# Patient Record
Sex: Female | Born: 1990 | Race: White | Hispanic: No | Marital: Married | Smoking: Never smoker
Health system: Southern US, Community
[De-identification: ages and names within clinical notes are randomized; demographics above are authoritative.]

## PROBLEM LIST (undated history)

## (undated) DIAGNOSIS — R112 Nausea with vomiting, unspecified: Secondary | ICD-10-CM

## (undated) DIAGNOSIS — Z789 Other specified health status: Secondary | ICD-10-CM

## (undated) DIAGNOSIS — Z9889 Other specified postprocedural states: Secondary | ICD-10-CM

## (undated) HISTORY — DX: Other specified health status: Z78.9

## (undated) HISTORY — DX: Nausea with vomiting, unspecified: R11.2

## (undated) HISTORY — DX: Other specified postprocedural states: Z98.890

---

## 2013-04-09 ENCOUNTER — Observation Stay (HOSPITAL_COMMUNITY)
Admission: EM | Admit: 2013-04-09 | Discharge: 2013-04-11 | Disposition: A | Discharge status: Home / Self Care | Payer: 59 | Attending: General Surgery | Admitting: General Surgery

## 2013-04-09 ENCOUNTER — Encounter (HOSPITAL_COMMUNITY): Payer: Self-pay | Admitting: *Deleted

## 2013-04-09 DIAGNOSIS — K3580 Unspecified acute appendicitis: Principal | ICD-10-CM | POA: Insufficient documentation

## 2013-04-09 LAB — COMPREHENSIVE METABOLIC PANEL
ALT: 17 U/L (ref 0–35)
AST: 20 U/L (ref 0–37)
Albumin: 4.8 g/dL (ref 3.5–5.2)
Alkaline Phosphatase: 63 U/L (ref 39–117)
BUN: 6 mg/dL (ref 6–23)
CO2: 27 mEq/L (ref 19–32)
Calcium: 10 mg/dL (ref 8.4–10.5)
Chloride: 103 mEq/L (ref 96–112)
Creatinine, Ser: 0.82 mg/dL (ref 0.50–1.10)
GFR calc Af Amer: >90 mL/min (ref >90)
GFR calc non Af Amer: >90 mL/min (ref >90)
Glucose, Bld: 110 mg/dL — ABNORMAL HIGH (ref 70–99)
Potassium: 3.3 mEq/L — ABNORMAL LOW (ref 3.5–5.1)
Sodium: 140 mEq/L (ref 135–145)
Total Bilirubin: 0.7 mg/dL (ref 0.3–1.2)
Total Protein: 7.1 g/dL (ref 6.0–8.3)

## 2013-04-09 LAB — CBC WITH DIFFERENTIAL/PLATELET
Basophils Absolute: 0 10*3/uL (ref 0.0–0.1)
Basophils Relative: 0 % (ref 0–1)
Eosinophils Absolute: 0.1 10*3/uL (ref 0.0–0.7)
Eosinophils Relative: 0 % (ref 0–5)
HCT: 41.5 % (ref 36.0–46.0)
Hemoglobin: 15.1 g/dL — ABNORMAL HIGH (ref 12.0–15.0)
Lymphocytes Relative: 9 % — ABNORMAL LOW (ref 12–46)
Lymphs Abs: 1.3 10*3/uL (ref 0.7–4.0)
MCH: 31.9 pg (ref 26.0–34.0)
MCHC: 36.4 g/dL — ABNORMAL HIGH (ref 30.0–36.0)
MCV: 87.6 fL (ref 78.0–100.0)
Monocytes Absolute: 1.1 10*3/uL — ABNORMAL HIGH (ref 0.1–1.0)
Monocytes Relative: 7 % (ref 3–12)
Neutro Abs: 12.5 10*3/uL — ABNORMAL HIGH (ref 1.7–7.7)
Neutrophils Relative %: 84 % — ABNORMAL HIGH (ref 43–77)
Platelets: 169 10*3/uL (ref 150–400)
RBC: 4.74 MIL/uL (ref 3.87–5.11)
RDW: 11.6 % (ref 11.5–15.5)
WBC: 14.9 10*3/uL — ABNORMAL HIGH (ref 4.0–10.5)

## 2013-04-09 LAB — URINALYSIS, ROUTINE W REFLEX MICROSCOPIC
Bilirubin Urine: NEGATIVE
Glucose, UA: NEGATIVE mg/dL
Hgb urine dipstick: NEGATIVE
Ketones, ur: 40 mg/dL — AB
Leukocytes, UA: NEGATIVE
Nitrite: NEGATIVE
Protein, ur: NEGATIVE mg/dL
Specific Gravity, Urine: 1.02 (ref 1.005–1.030)
Urobilinogen, UA: 0.2 mg/dL (ref 0.0–1.0)
pH: 6.5 (ref 5.0–8.0)

## 2013-04-09 LAB — POCT PREGNANCY, URINE: Preg Test, Ur: NEGATIVE

## 2013-04-09 MED ORDER — ONDANSETRON HCL 4 MG/2ML IJ SOLN
4.0000 mg | Freq: Once | INTRAMUSCULAR | Status: AC
  Administered 2013-04-09: 4 mg via INTRAVENOUS
  Filled 2013-04-09: qty 2

## 2013-04-09 MED ORDER — IOHEXOL 300 MG/ML  SOLN
50.0000 mL | Freq: Once | INTRAMUSCULAR | Status: AC | PRN
  Administered 2013-04-09: 50 mL via ORAL

## 2013-04-09 MED ORDER — POTASSIUM CHLORIDE CRYS ER 20 MEQ PO TBCR
40.0000 meq | EXTENDED_RELEASE_TABLET | Freq: Once | ORAL | Status: DC
  Filled 2013-04-09: qty 2

## 2013-04-09 MED ORDER — MORPHINE SULFATE 4 MG/ML IJ SOLN
4.0000 mg | Freq: Once | INTRAMUSCULAR | Status: AC
  Administered 2013-04-09: 4 mg via INTRAVENOUS
  Filled 2013-04-09: qty 1

## 2013-04-09 MED ORDER — ONDANSETRON 4 MG PO TBDP
8.0000 mg | ORAL_TABLET | Freq: Once | ORAL | Status: AC
  Administered 2013-04-09: 8 mg via ORAL

## 2013-04-09 MED ORDER — SODIUM CHLORIDE 0.9 % IV SOLN
INTRAVENOUS | Status: DC
  Administered 2013-04-09: 23:00:00 via INTRAVENOUS

## 2013-04-09 NOTE — ED Provider Notes (Signed)
History     CSN: [id number]  Arrival date & time 04/09/13  1943   First MD Initiated Contact with Patient 04/09/13 2209      Chief Complaint  Patient presents with  . Abdominal Pain    (Consider location/radiation/quality/duration/timing/severity/associated sxs/prior treatment) HPI Comments: [name] is a 22 year old white female presenting with acute onset severe abdominal pain that started at around 7am this morning but got worse around 3pm and onwards.  She woke up with abdominal discomfort, drank a smoothie for breakfast and took 1 aleve and went to work. She continued to have discomfort, ate some lunch and around 3pm her abdominal pain became severe and difficult to tolerate.  She then went home and at that point had nausea and vomiting x3 episodes.  She denies diarrhea but has had 2 BMs today, the last one was a little loose.  She is passing gas.  The abdominal pain is severe, currently 7-8/10, worse with any movement and coughing, slightly improved when lying down, associated with nausea and vomiting and feeling hot and cold. She also endorses getting light-headed after her blood draw upon standing and nearly fainting. She denies any similar prior episodes, denies chest pain, shortness of breath, urinary complaints, constipation, headaches, numbness or tingling.  She is not currently sexually active, denies any tobacco or illicit drug use, and occasionally drinks wine.  Urine preg was negative. LMP last month around April 21st, regular, 7 days.  Not on any birth control.  Never been pregnant.    Patient is a 22 y.o. female presenting with abdominal pain. The history is provided by the patient. No language interpreter was used.  Abdominal Pain Pain location:  Generalized Pain quality: aching and sharp   Pain radiates to:  Epigastric region, RUQ, RLQ and periumbilical region Pain severity:  Severe Onset quality:  Sudden Duration:  15 hours Timing:  Constant Progression:   Worsening Chronicity:  New Context: not diet changes, not previous surgeries, not recent illness, not recent sexual activity and not suspicious food intake   Relieved by:  Nothing Worsened by:  Movement Ineffective treatments:  NSAIDs, eating and flatus Associated symptoms: nausea and vomiting     History reviewed. No pertinent past medical history.  History reviewed. No pertinent past surgical history.  History reviewed. No pertinent family history.  History  Substance Use Topics  . Smoking status: Never Smoker   . Smokeless tobacco: Not on file  . Alcohol Use: Yes    OB History   Grav Para Term Preterm Abortions TAB SAB Ect Mult Living                  Review of Systems  Constitutional: Negative.   HENT: Negative.   Eyes: Negative.   Respiratory: Negative.   Cardiovascular: Negative.   Gastrointestinal: Positive for nausea, vomiting and abdominal pain.  Endocrine: Negative.   Genitourinary: Negative.   Musculoskeletal: Negative.   Skin: Negative.   Allergic/Immunologic: Negative.   Neurological: Negative.   Hematological: Negative.   Psychiatric/Behavioral: Negative.     Allergies  Latex  Home Medications   Current Outpatient Rx  Name  Route  Sig  Dispense  Refill  . naphazoline-pheniramine (NAPHCON-A) 0.025-0.3 % ophthalmic solution   Both Eyes   Place 1 drop into both eyes 4 (four) times daily as needed (for dry eyes).          . naproxen sodium (ANAPROX) 220 MG tablet   Oral   Take 220 mg by  mouth 2 (two) times daily with a meal.           BP 105/73  Pulse 79  Temp(Src) 97.7 F (36.5 C) (Oral)  Resp 16  SpO2 100%  Physical Exam  Constitutional: She is oriented to person, place, and time. She appears well-developed and well-nourished.  HENT:  Head: Normocephalic and atraumatic.  Eyes: Conjunctivae and EOM are normal. Pupils are equal, round, and reactive to light.  Neck: Normal range of motion. Neck supple.  Cardiovascular: Normal  rate, regular rhythm, normal heart sounds and intact distal pulses.   Pulmonary/Chest: Effort normal and breath sounds normal.  Abdominal: Soft. She exhibits no distension. There is tenderness. There is rebound and guarding.  Diffuse abdominal tenderness to light palpation, R>L.  Pain with movement, cough, and inspiration  Genitourinary: Vagina normal.  Pelvic exam: no adnexal masses palpated.  No cervical motion tenderness, cervix smooth, no visible lesions or scars.  + mucous. Complaints of abdominal pain during exam  Musculoskeletal: Normal range of motion. She exhibits no edema and no tenderness.  Neurological: She is alert and oriented to person, place, and time. No cranial nerve deficit.  Skin: Skin is warm and dry. There is erythema.  Right thigh erythematous patch at site of sunburn  Psychiatric: She has a normal mood and affect. Her behavior is normal. Judgment and thought content normal.    ED Course  Procedures (including critical care time)  Labs Reviewed  URINALYSIS, ROUTINE W REFLEX MICROSCOPIC - Abnormal; Notable for the following:    APPearance HAZY (*)    Ketones, ur 40 (*)    All other components within normal limits  CBC WITH DIFFERENTIAL - Abnormal; Notable for the following:    WBC 14.9 (*)    Hemoglobin 15.1 (*)    MCHC 36.4 (*)    Neutrophils Relative 84 (*)    Neutro Abs 12.5 (*)    Lymphocytes Relative 9 (*)    Monocytes Absolute 1.1 (*)    All other components within normal limits  COMPREHENSIVE METABOLIC PANEL - Abnormal; Notable for the following:    Potassium 3.3 (*)    Glucose, Bld 110 (*)    All other components within normal limits  POCT PREGNANCY, URINE   No results found.   No diagnosis found.    MDM  [name] is a 22 year old female with acute abdominal pain since this morning.  The pain is worsening and associated with nausea and vomiting.  Noted to have leukocytosis and feeling hot and cold.  Concerning for acute abdomen including  appendicitis as most likely vs. Pancreatitis vs. Ovarian torsion vs. Cyst rupture vs. Nephrolithiasis among broad differentials.  Urine preg negative.    -cbc with wbc 14.9 and 12.5 neutrophils -cmet k3.3 -u/a: negative for nitrite or Hgb or leukocytes -CT Abdomen and pelvis -NPO -IVF--NS -morphine 4mg  iv x1 -zofran prn nausea  Case discussed with Dr. Lonn Georgia, MD 04/09/13 1610  Darden Palmer, MD 04/09/13 2358

## 2013-04-09 NOTE — ED Notes (Addendum)
Pt states that she started having sharp constant abdominal pain since 15:00. Pt states that her abdomen hurt before she ate, but she did eat and then vomited everything back up. Pt states it is across her lower abdomen, pt denies problems with urine but did have loose bowel movements.

## 2013-04-10 ENCOUNTER — Encounter (HOSPITAL_COMMUNITY): Payer: Self-pay | Admitting: Anesthesiology

## 2013-04-10 ENCOUNTER — Emergency Department (HOSPITAL_COMMUNITY): Payer: 59

## 2013-04-10 ENCOUNTER — Emergency Department (HOSPITAL_COMMUNITY): Payer: 59 | Admitting: Anesthesiology

## 2013-04-10 ENCOUNTER — Encounter (HOSPITAL_COMMUNITY)
Admission: EM | Disposition: A | Discharge status: Home / Self Care | Payer: Self-pay | Source: Home / Self Care | Attending: Emergency Medicine

## 2013-04-10 DIAGNOSIS — K3580 Unspecified acute appendicitis: Secondary | ICD-10-CM

## 2013-04-10 HISTORY — PX: LAPAROSCOPIC APPENDECTOMY: SHX408

## 2013-04-10 LAB — WET PREP, GENITAL
Trich, Wet Prep: NONE SEEN
Yeast Wet Prep HPF POC: NONE SEEN

## 2013-04-10 LAB — GC/CHLAMYDIA PROBE AMP
CT Probe RNA: NEGATIVE
GC Probe RNA: NEGATIVE

## 2013-04-10 SURGERY — APPENDECTOMY, LAPAROSCOPIC
Anesthesia: General | Site: Abdomen | Wound class: Clean Contaminated

## 2013-04-10 MED ORDER — LIDOCAINE HCL (CARDIAC) 20 MG/ML IV SOLN
INTRAVENOUS | Status: DC | PRN
  Administered 2013-04-10: 40 mg via INTRAVENOUS

## 2013-04-10 MED ORDER — OXYCODONE HCL 5 MG PO TABS: 5.0000 mg | ORAL_TABLET | Freq: Once | ORAL | Status: DC | PRN

## 2013-04-10 MED ORDER — PIPERACILLIN-TAZOBACTAM 3.375 G IVPB
3.3750 g | Freq: Once | INTRAVENOUS | Status: AC
  Administered 2013-04-10: 3.375 g via INTRAVENOUS
  Filled 2013-04-10: qty 50

## 2013-04-10 MED ORDER — IOHEXOL 300 MG/ML  SOLN
100.0000 mL | Freq: Once | INTRAMUSCULAR | Status: AC | PRN
  Administered 2013-04-10: 100 mL via INTRAVENOUS

## 2013-04-10 MED ORDER — MORPHINE SULFATE 2 MG/ML IJ SOLN
2.0000 mg | INTRAMUSCULAR | Status: DC | PRN
  Administered 2013-04-10 (×5): 2 mg via INTRAVENOUS
  Filled 2013-04-10 (×6): qty 1

## 2013-04-10 MED ORDER — ACETAMINOPHEN 325 MG PO TABS: 650.0000 mg | ORAL_TABLET | Freq: Four times a day (QID) | ORAL | Status: DC | PRN

## 2013-04-10 MED ORDER — HYDROMORPHONE HCL PF 1 MG/ML IJ SOLN
0.2500 mg | INTRAMUSCULAR | Status: DC | PRN
  Administered 2013-04-10 (×4): 0.5 mg via INTRAVENOUS

## 2013-04-10 MED ORDER — SODIUM CHLORIDE 0.9 % IV SOLN
INTRAVENOUS | Status: DC
  Administered 2013-04-10 – 2013-04-11 (×3): via INTRAVENOUS

## 2013-04-10 MED ORDER — PROMETHAZINE HCL 25 MG/ML IJ SOLN
12.5000 mg | Freq: Four times a day (QID) | INTRAMUSCULAR | Status: DC | PRN
  Administered 2013-04-10: 6.25 mg via INTRAVENOUS
  Filled 2013-04-10: qty 1

## 2013-04-10 MED ORDER — OXYCODONE HCL 5 MG/5ML PO SOLN: 5.0000 mg | Freq: Once | ORAL | Status: DC | PRN

## 2013-04-10 MED ORDER — ROCURONIUM BROMIDE 100 MG/10ML IV SOLN
INTRAVENOUS | Status: DC | PRN
  Administered 2013-04-10: 30 mg via INTRAVENOUS

## 2013-04-10 MED ORDER — NEOSTIGMINE METHYLSULFATE 1 MG/ML IJ SOLN
INTRAMUSCULAR | Status: DC | PRN
  Administered 2013-04-10: 4 mg via INTRAVENOUS

## 2013-04-10 MED ORDER — MIDAZOLAM HCL 5 MG/5ML IJ SOLN
INTRAMUSCULAR | Status: DC | PRN
  Administered 2013-04-10: 2 mg via INTRAVENOUS

## 2013-04-10 MED ORDER — ACETAMINOPHEN 650 MG RE SUPP: 650.0000 mg | Freq: Four times a day (QID) | RECTAL | Status: DC | PRN

## 2013-04-10 MED ORDER — SUCCINYLCHOLINE CHLORIDE 20 MG/ML IJ SOLN
INTRAMUSCULAR | Status: DC | PRN
  Administered 2013-04-10: 80 mg via INTRAVENOUS

## 2013-04-10 MED ORDER — SODIUM CHLORIDE 0.9 % IR SOLN
Status: DC | PRN
  Administered 2013-04-10: 1000 mL

## 2013-04-10 MED ORDER — ONDANSETRON HCL 4 MG/2ML IJ SOLN
4.0000 mg | Freq: Four times a day (QID) | INTRAMUSCULAR | Status: DC | PRN
  Administered 2013-04-10 (×2): 4 mg via INTRAVENOUS
  Filled 2013-04-10 (×2): qty 2

## 2013-04-10 MED ORDER — BUPIVACAINE-EPINEPHRINE 0.25% -1:200000 IJ SOLN
INTRAMUSCULAR | Status: DC | PRN
  Administered 2013-04-10: 10 mL

## 2013-04-10 MED ORDER — FENTANYL CITRATE 0.05 MG/ML IJ SOLN
INTRAMUSCULAR | Status: DC | PRN
  Administered 2013-04-10 (×2): 50 ug via INTRAVENOUS
  Administered 2013-04-10: 100 ug via INTRAVENOUS

## 2013-04-10 MED ORDER — LACTATED RINGERS IV SOLN
INTRAVENOUS | Status: DC | PRN
  Administered 2013-04-10 (×2): via INTRAVENOUS

## 2013-04-10 MED ORDER — PIPERACILLIN-TAZOBACTAM 3.375 G IVPB
3.3750 g | Freq: Three times a day (TID) | INTRAVENOUS | Status: DC
  Administered 2013-04-10 – 2013-04-11 (×3): 3.375 g via INTRAVENOUS
  Filled 2013-04-10 (×5): qty 50

## 2013-04-10 MED ORDER — PROPOFOL 10 MG/ML IV BOLUS
INTRAVENOUS | Status: DC | PRN
  Administered 2013-04-10: 120 mg via INTRAVENOUS

## 2013-04-10 MED ORDER — ONDANSETRON HCL 4 MG/2ML IJ SOLN
INTRAMUSCULAR | Status: DC | PRN
  Administered 2013-04-10: 4 mg via INTRAVENOUS

## 2013-04-10 MED ORDER — HYDROMORPHONE HCL PF 1 MG/ML IJ SOLN
INTRAMUSCULAR | Status: AC
  Filled 2013-04-10: qty 1

## 2013-04-10 MED ORDER — OXYCODONE HCL 5 MG PO TABS: 5.0000 mg | ORAL_TABLET | ORAL | Status: DC | PRN

## 2013-04-10 MED ORDER — GLYCOPYRROLATE 0.2 MG/ML IJ SOLN
INTRAMUSCULAR | Status: DC | PRN
  Administered 2013-04-10: .6 mg via INTRAVENOUS

## 2013-04-10 SURGICAL SUPPLY — 32 items
CANISTER SUCTION 2500CC (MISCELLANEOUS) ×2 IMPLANT
CHLORAPREP W/TINT 26ML (MISCELLANEOUS) ×2 IMPLANT
CLOTH BEACON ORANGE TIMEOUT ST (SAFETY) ×2 IMPLANT
COVER SURGICAL LIGHT HANDLE (MISCELLANEOUS) ×2 IMPLANT
CUTTER FLEX LINEAR 45M (STAPLE) ×2 IMPLANT
DERMABOND ADVANCED (GAUZE/BANDAGES/DRESSINGS) ×1
DERMABOND ADVANCED .7 DNX12 (GAUZE/BANDAGES/DRESSINGS) ×1 IMPLANT
ELECT REM PT RETURN 9FT ADLT (ELECTROSURGICAL) ×2
ELECTRODE REM PT RTRN 9FT ADLT (ELECTROSURGICAL) ×1 IMPLANT
GLOVE BIOGEL PI IND STRL 7.5 (GLOVE) ×1 IMPLANT
GLOVE BIOGEL PI INDICATOR 7.5 (GLOVE) ×1
GOWN STRL NON-REIN LRG LVL3 (GOWN DISPOSABLE) ×6 IMPLANT
GOWN STRL REIN 3XL XLG LVL4 (GOWN DISPOSABLE) ×4 IMPLANT
KIT BASIN OR (CUSTOM PROCEDURE TRAY) ×2 IMPLANT
KIT ROOM TURNOVER OR (KITS) ×2 IMPLANT
NS IRRIG 1000ML POUR BTL (IV SOLUTION) ×2 IMPLANT
PAD ARMBOARD 7.5X6 YLW CONV (MISCELLANEOUS) ×4 IMPLANT
POUCH SPECIMEN RETRIEVAL 10MM (ENDOMECHANICALS) ×2 IMPLANT
RELOAD 45 VASCULAR/THIN (ENDOMECHANICALS) IMPLANT
RELOAD STAPLE TA45 3.5 REG BLU (ENDOMECHANICALS) ×2 IMPLANT
SCALPEL HARMONIC ACE (MISCELLANEOUS) ×2 IMPLANT
SCISSORS LAP 5X35 DISP (ENDOMECHANICALS) IMPLANT
SET IRRIG TUBING LAPAROSCOPIC (IRRIGATION / IRRIGATOR) ×2 IMPLANT
SLEEVE ENDOPATH XCEL 5M (ENDOMECHANICALS) ×2 IMPLANT
SPECIMEN JAR SMALL (MISCELLANEOUS) ×2 IMPLANT
SUT MNCRL AB 4-0 PS2 18 (SUTURE) ×2 IMPLANT
TOWEL OR 17X24 6PK STRL BLUE (TOWEL DISPOSABLE) ×2 IMPLANT
TOWEL OR 17X26 10 PK STRL BLUE (TOWEL DISPOSABLE) ×2 IMPLANT
TRAY FOLEY CATH 14FR (SET/KITS/TRAYS/PACK) ×2 IMPLANT
TRAY LAPAROSCOPIC (CUSTOM PROCEDURE TRAY) ×2 IMPLANT
TROCAR XCEL BLUNT TIP 100MML (ENDOMECHANICALS) ×2 IMPLANT
TROCAR XCEL NON-BLD 5MMX100MML (ENDOMECHANICALS) ×2 IMPLANT

## 2013-04-10 NOTE — Progress Notes (Signed)
Post op nausea and extreme tenderness.  She is sleepy after taking phenergan, still not able to eat, beyond few sips of clears.  Continue Zosyn, and recheck labs in AM.

## 2013-04-10 NOTE — ED Provider Notes (Signed)
Care received from prior team with CT scan of abd/pelvis pending.  Concern for appendicitis.  CT scan confirms appendicitis.  D/w patient and friends, updated on findings.  C/w Dr Dwain Sarna on call for surgery who will see the patient in the ED.  Results for orders placed during the hospital encounter of 04/09/13  WET PREP, GENITAL      Result Value Range   Yeast Wet Prep HPF POC NONE SEEN  NONE SEEN   Trich, Wet Prep NONE SEEN  NONE SEEN   Clue Cells Wet Prep HPF POC FEW (*) NONE SEEN   WBC, Wet Prep HPF POC FEW (*) NONE SEEN  URINALYSIS, ROUTINE W REFLEX MICROSCOPIC      Result Value Range   Color, Urine YELLOW  YELLOW   APPearance HAZY (*) CLEAR   Specific Gravity, Urine 1.020  1.005 - 1.030   pH 6.5  5.0 - 8.0   Glucose, UA NEGATIVE  NEGATIVE mg/dL   Hgb urine dipstick NEGATIVE  NEGATIVE   Bilirubin Urine NEGATIVE  NEGATIVE   Ketones, ur 40 (*) NEGATIVE mg/dL   Protein, ur NEGATIVE  NEGATIVE mg/dL   Urobilinogen, UA 0.2  0.0 - 1.0 mg/dL   Nitrite NEGATIVE  NEGATIVE   Leukocytes, UA NEGATIVE  NEGATIVE  CBC WITH DIFFERENTIAL      Result Value Range   WBC 14.9 (*) 4.0 - 10.5 K/uL   RBC 4.74  3.87 - 5.11 MIL/uL   Hemoglobin 15.1 (*) 12.0 - 15.0 g/dL   HCT 16.1  09.6 - 04.5 %   MCV 87.6  78.0 - 100.0 fL   MCH 31.9  26.0 - 34.0 pg   MCHC 36.4 (*) 30.0 - 36.0 g/dL   RDW 40.9  81.1 - 91.4 %   Platelets 169  150 - 400 K/uL   Neutrophils Relative 84 (*) 43 - 77 %   Neutro Abs 12.5 (*) 1.7 - 7.7 K/uL   Lymphocytes Relative 9 (*) 12 - 46 %   Lymphs Abs 1.3  0.7 - 4.0 K/uL   Monocytes Relative 7  3 - 12 %   Monocytes Absolute 1.1 (*) 0.1 - 1.0 K/uL   Eosinophils Relative 0  0 - 5 %   Eosinophils Absolute 0.1  0.0 - 0.7 K/uL   Basophils Relative 0  0 - 1 %   Basophils Absolute 0.0  0.0 - 0.1 K/uL  COMPREHENSIVE METABOLIC PANEL      Result Value Range   Sodium 140  135 - 145 mEq/L   Potassium 3.3 (*) 3.5 - 5.1 mEq/L   Chloride 103  96 - 112 mEq/L   CO2 27  19 - 32 mEq/L   Glucose, Bld 110 (*) 70 - 99 mg/dL   BUN 6  6 - 23 mg/dL   Creatinine, Ser 7.82  0.50 - 1.10 mg/dL   Calcium 95.6  8.4 - 21.3 mg/dL   Total Protein 7.1  6.0 - 8.3 g/dL   Albumin 4.8  3.5 - 5.2 g/dL   AST 20  0 - 37 U/L   ALT 17  0 - 35 U/L   Alkaline Phosphatase 63  39 - 117 U/L   Total Bilirubin 0.7  0.3 - 1.2 mg/dL   GFR calc non Af Amer >90  >90 mL/min   GFR calc Af Amer >90  >90 mL/min  POCT PREGNANCY, URINE      Result Value Range   Preg Test, Ur NEGATIVE  NEGATIVE   Ct Abdomen  Pelvis W Contrast  04/10/2013  *RADIOLOGY REPORT*  Clinical Data: Abdominal pain  CT ABDOMEN AND PELVIS WITH CONTRAST  Technique:  Multidetector CT imaging of the abdomen and pelvis was performed following the standard protocol during bolus administration of intravenous contrast.  Contrast: OMNIPAQUE IOHEXOL 300 MG/ML  SOLN  Comparison: None.  Findings: Limited images through the lung bases demonstrate no significant appreciable abnormality. The heart size is within normal limits. No pleural or pericardial effusion.  Unremarkable liver, biliary system, spleen, adrenal glands.  Symmetric renal enhancement.  No hydronephrosis or hydroureter.  No CT evidence for colitis.  Small bowel loops are normal course and caliber.  Normal caliber vasculature.  Appendix is dilated with periappendiceal fat stranding and ill- defined fluid.  Measures up to 8 mm in diameter.  Reactive size ileocolic lymph nodes.  No loculated fluid collection or free intraperitoneal air.  Thin-walled bladder.  Unremarkable CT appearance to the uterus and adnexa.  No acute osseous finding.  IMPRESSION: Acute appendicitis.  Discussed via telephone with Dr. Norlene Campbell at 12:10 a.m. on 04/10/2013.   Original Report Authenticated By: Jearld Lesch, M.D.       Olivia Mackie, MD 04/10/13 [phone]

## 2013-04-10 NOTE — H&P (Signed)
Melanie Lawson is an 22 y.o. female.   Chief Complaint: ab pain HPI: 22 yo healthy female who presents with less than 24 hour history of abdominal pain localized to rlq.  She denies fever.  She is anorectic.  She is having bms.  No n/v.  She came in today as pain worsened and nothing was alleviating it.  She was doubled over and unable to move this afternoon.  She has undergone evaluation in er and appears to have appendicitis.  History reviewed. No pertinent past medical history.  History reviewed. No pertinent past surgical history.  History reviewed. No pertinent family history. Social History:  reports that she has never smoked. She does not have any smokeless tobacco history on file. She reports that  drinks alcohol. She reports that she does not use illicit drugs.  Allergies:  Allergies  Allergen Reactions  . Latex Itching and Rash    Meds none  Results for orders placed during the hospital encounter of 04/09/13 (from the past 48 hour(s))  CBC WITH DIFFERENTIAL     Status: Abnormal   Collection Time    04/09/13  7:57 PM      Result Value Range   WBC 14.9 (*) 4.0 - 10.5 K/uL   RBC 4.74  3.87 - 5.11 MIL/uL   Hemoglobin 15.1 (*) 12.0 - 15.0 g/dL   HCT 16.1  09.6 - 04.5 %   MCV 87.6  78.0 - 100.0 fL   MCH 31.9  26.0 - 34.0 pg   MCHC 36.4 (*) 30.0 - 36.0 g/dL   RDW 40.9  81.1 - 91.4 %   Platelets 169  150 - 400 K/uL   Neutrophils Relative 84 (*) 43 - 77 %   Neutro Abs 12.5 (*) 1.7 - 7.7 K/uL   Lymphocytes Relative 9 (*) 12 - 46 %   Lymphs Abs 1.3  0.7 - 4.0 K/uL   Monocytes Relative 7  3 - 12 %   Monocytes Absolute 1.1 (*) 0.1 - 1.0 K/uL   Eosinophils Relative 0  0 - 5 %   Eosinophils Absolute 0.1  0.0 - 0.7 K/uL   Basophils Relative 0  0 - 1 %   Basophils Absolute 0.0  0.0 - 0.1 K/uL  COMPREHENSIVE METABOLIC PANEL     Status: Abnormal   Collection Time    04/09/13  7:57 PM      Result Value Range   Sodium 140  135 - 145 mEq/L   Potassium 3.3 (*) 3.5 - 5.1 mEq/L    Chloride 103  96 - 112 mEq/L   CO2 27  19 - 32 mEq/L   Glucose, Bld 110 (*) 70 - 99 mg/dL   BUN 6  6 - 23 mg/dL   Creatinine, Ser 7.82  0.50 - 1.10 mg/dL   Calcium 95.6  8.4 - 21.3 mg/dL   Total Protein 7.1  6.0 - 8.3 g/dL   Albumin 4.8  3.5 - 5.2 g/dL   AST 20  0 - 37 U/L   ALT 17  0 - 35 U/L   Alkaline Phosphatase 63  39 - 117 U/L   Total Bilirubin 0.7  0.3 - 1.2 mg/dL   GFR calc non Af Amer >90  >90 mL/min   GFR calc Af Amer >90  >90 mL/min   Comment:            The eGFR has been calculated     using the CKD EPI equation.     This  calculation has not been     validated in all clinical     situations.     eGFR's persistently     <90 mL/min signify     possible Chronic Kidney Disease.  URINALYSIS, ROUTINE W REFLEX MICROSCOPIC     Status: Abnormal   Collection Time    04/09/13  8:11 PM      Result Value Range   Color, Urine YELLOW  YELLOW   APPearance HAZY (*) CLEAR   Specific Gravity, Urine 1.020  1.005 - 1.030   pH 6.5  5.0 - 8.0   Glucose, UA NEGATIVE  NEGATIVE mg/dL   Hgb urine dipstick NEGATIVE  NEGATIVE   Bilirubin Urine NEGATIVE  NEGATIVE   Ketones, ur 40 (*) NEGATIVE mg/dL   Protein, ur NEGATIVE  NEGATIVE mg/dL   Urobilinogen, UA 0.2  0.0 - 1.0 mg/dL   Nitrite NEGATIVE  NEGATIVE   Leukocytes, UA NEGATIVE  NEGATIVE   Comment: MICROSCOPIC NOT DONE ON URINES WITH NEGATIVE PROTEIN, BLOOD, LEUKOCYTES, NITRITE, OR GLUCOSE <1000 mg/dL.  POCT PREGNANCY, URINE     Status: None   Collection Time    04/09/13  8:17 PM      Result Value Range   Preg Test, Ur NEGATIVE  NEGATIVE   Comment:            THE SENSITIVITY OF THIS     METHODOLOGY IS >24 mIU/mL  WET PREP, GENITAL     Status: Abnormal   Collection Time    04/09/13 11:53 PM      Result Value Range   Yeast Wet Prep HPF POC NONE SEEN  NONE SEEN   Trich, Wet Prep NONE SEEN  NONE SEEN   Clue Cells Wet Prep HPF POC FEW (*) NONE SEEN   WBC, Wet Prep HPF POC FEW (*) NONE SEEN   Ct Abdomen Pelvis W  Contrast  04/10/2013  *RADIOLOGY REPORT*  Clinical Data: Abdominal pain  CT ABDOMEN AND PELVIS WITH CONTRAST  Technique:  Multidetector CT imaging of the abdomen and pelvis was performed following the standard protocol during bolus administration of intravenous contrast.  Contrast: OMNIPAQUE IOHEXOL 300 MG/ML  SOLN  Comparison: None.  Findings: Limited images through the lung bases demonstrate no significant appreciable abnormality. The heart size is within normal limits. No pleural or pericardial effusion.  Unremarkable liver, biliary system, spleen, adrenal glands.  Symmetric renal enhancement.  No hydronephrosis or hydroureter.  No CT evidence for colitis.  Small bowel loops are normal course and caliber.  Normal caliber vasculature.  Appendix is dilated with periappendiceal fat stranding and ill- defined fluid.  Measures up to 8 mm in diameter.  Reactive size ileocolic lymph nodes.  No loculated fluid collection or free intraperitoneal air.  Thin-walled bladder.  Unremarkable CT appearance to the uterus and adnexa.  No acute osseous finding.  IMPRESSION: Acute appendicitis.  Discussed via telephone with Dr. Norlene Campbell at 12:10 a.m. on 04/10/2013.   Original Report Authenticated By: Jearld Lesch, M.D.     Review of Systems  Constitutional: Negative for fever and chills.  Gastrointestinal: Positive for abdominal pain. Negative for nausea and vomiting.    Blood pressure 105/73, pulse 79, temperature 97.7 F (36.5 C), temperature source Oral, resp. rate 16, last menstrual period 03/27/2013, SpO2 100.00%. Physical Exam  Vitals reviewed. Constitutional: She appears well-developed and well-nourished.  HENT:  Head: Normocephalic and atraumatic.  Eyes: No scleral icterus.  Cardiovascular: Normal rate, regular rhythm and normal heart sounds.  Respiratory: Effort normal and breath sounds normal. She has no wheezes. She has no rales.  GI: Soft. Bowel sounds are normal. She exhibits no distension.  There is tenderness. There is tenderness at McBurney's point. No hernia.  Lymphadenopathy:    She has no cervical adenopathy.     Assessment/Plan Acute appendicitis  I discussed pathophysiology of appendicitis. We discussed lap appy with risks being but not limited to bleeding, infection, open procedure, injury to other structures.  Will give abx and proceed soon.  Melanie Lawson 04/10/2013, 1:15 AM

## 2013-04-10 NOTE — Progress Notes (Signed)
ANTIBIOTIC CONSULT NOTE - INITIAL  Pharmacy Consult:  Zosyn Indication:  Empiric therapy  Allergies  Allergen Reactions  . Latex Itching and Rash    Patient Measurements: Height: 5\' 7"  (170.2 cm) Weight: 140 lb (63.504 kg) IBW/kg (Calculated) : 61.6  Vital Signs: Temp: 97.9 F (36.6 C) (05/13 0345) Temp src: Oral (05/12 2200) BP: 95/63 mmHg (05/13 0345) Pulse Rate: 69 (05/13 0345) Intake/Output from previous day: 05/12 0701 - 05/13 0700 In: 1000 [I.V.:1000] Out: 100 [Urine:100] Intake/Output from this shift: Total I/O In: 1000 [I.V.:1000] Out: 100 [Urine:100]  Labs:  Recent Labs  04/09/13 1957  WBC 14.9*  HGB 15.1*  PLT 169  CREATININE 0.82   Estimated Creatinine Clearance: 105.5 ml/min (by C-G formula based on Cr of 0.82). No results found for this basename: VANCOTROUGH, VANCOPEAK, VANCORANDOM, GENTTROUGH, GENTPEAK, GENTRANDOM, TOBRATROUGH, TOBRAPEAK, TOBRARND, AMIKACINPEAK, AMIKACINTROU, AMIKACIN,  in the last 72 hours   Microbiology: Recent Results (from the past 720 hour(s))  WET PREP, GENITAL     Status: Abnormal   Collection Time    04/09/13 11:53 PM      Result Value Range Status   Yeast Wet Prep HPF POC NONE SEEN  NONE SEEN Final   Trich, Wet Prep NONE SEEN  NONE SEEN Final   Clue Cells Wet Prep HPF POC FEW (*) NONE SEEN Final   WBC, Wet Prep HPF POC FEW (*) NONE SEEN Final    Medical History: History reviewed. No pertinent past medical history.     Assessment: 63 YOF with no PMH admitted with acute abdominal pain associated with nausea and vomiting.  Diagnosed with appendicitis, now s/p laparoscopic appendectomy.  Pharmacy consulted to manage Zosyn as empiric therapy.  Patient with excellent renal function.  Noted she received Zosyn at 0200 today.   Goal of Therapy:  Infection prevention   Plan:  - Zosyn 3.375gm IV Q8H, 4 hr infusion, next dose at 1000 - Monitor renal fxn, clinical course, abx LOT     Ezeriah Luty D. Laney Potash, PharmD,  BCPS Pager:  [phone] 04/10/2013, 4:06 AM

## 2013-04-10 NOTE — Anesthesia Preprocedure Evaluation (Addendum)
Anesthesia Evaluation  Patient identified by MRN, date of birth, ID band Patient awake    Reviewed: Allergy & Precautions, H&P , NPO status , Patient's Chart, lab work & pertinent test results  Airway Mallampati: I TM Distance: >3 FB Neck ROM: Full    Dental  (+) Teeth Intact and Dental Advisory Given   Pulmonary neg pulmonary ROS,  breath sounds clear to auscultation        Cardiovascular negative cardio ROS  Rhythm:Regular Rate:Normal     Neuro/Psych negative neurological ROS     GI/Hepatic negative GI ROS, Neg liver ROS,   Endo/Other  negative endocrine ROS  Renal/GU negative Renal ROS     Musculoskeletal negative musculoskeletal ROS (+)   Abdominal   Peds  Hematology negative hematology ROS (+)   Anesthesia Other Findings   Reproductive/Obstetrics negative OB ROS                           Anesthesia Physical Anesthesia Plan  ASA: I and emergent  Anesthesia Plan: General   Post-op Pain Management:    Induction: Intravenous, Cricoid pressure planned and Rapid sequence  Airway Management Planned: Oral ETT  Additional Equipment:   Intra-op Plan:   Post-operative Plan: Extubation in OR  Informed Consent:   Dental advisory given  Plan Discussed with: Anesthesiologist and Surgeon  Anesthesia Plan Comments:        Anesthesia Quick Evaluation

## 2013-04-10 NOTE — ED Provider Notes (Signed)
I saw and evaluated the patient, reviewed the resident's note and I agree with the findings and plan.  Toy Baker, MD 04/10/13 [phone]

## 2013-04-10 NOTE — Progress Notes (Signed)
Melanie Tieszen, MD, MPH, FACS Pager: [phone]  

## 2013-04-10 NOTE — Op Note (Signed)
Preoperative diagnosis: Acute appendicitis Postoperative diagnosis: Same as above Procedure: Laparoscopic appendectomy Surgeon: Dr. Harden Mo Anesthesia: Gen. Endotracheal Estimated blood loss: Minimal Drains: None Specimens: Appendix to pathology Complications: None Disposition to recovery stable Sponge needle count correct at end of operation  Indications: This is a healthy 22 year old female with a less than 24-hour history of abdominal pain localizing to right lower quadrant. She has an elevated white blood cell count, CT scan, and an exam consistent with acute appendicitis. I discussed a laparoscopic appendectomy.  Procedure: After informed consent was obtained the patient was taken to the operating room. She was administered Zosyn. Sequential compression devices on her legs. She was placed under general endotracheal anesthesia without complication. Her abdomen was prepped and draped in the standard sterile surgical fashion. A Foley catheter was placed. The surgical timeout was performed.  I injected marcaine below her umbilicus. I made a curvilinear incision below her umbilicus. I carried this to the fascia. I incised the fascia sharply. I entered the peritoneum bluntly. I then placed a 0 Vicryl pursestring suture to the fascia. I then inserted a Hassan trocar and insufflated the abdomen to 15 mm mercury pressure. I then inserted 2 further 5 mm trocars in the suprapubic region and left lower quadrant after infiltration of local anesthetic without complication. I then was able to identify her cecum and her terminal ileum. Her appendix was in the retrocecal position and this was brought up. She had acute appendicitis without perforation. I then was able to dissect the base free from the colon. I divided the mesentery with the Harmonic scalpel. I then divided the base with a GIA stapler. I places an Endo Catch bag and removed from the umbilicus. Upon completion the staple line was clean and  hemostatic. There was no injury to the small bowel or the remainder of the cecum. I then irrigated. This was clear. I then desufflated the abdomen and removed my trocars. I tied my pursestring down and this completely obliterated the umbilical defect. I then closed using 4-0 Monocryl and Dermabond. She tolerated this well was extubated and transferred to recovery stable.

## 2013-04-10 NOTE — Anesthesia Procedure Notes (Signed)
Procedure Name: Intubation Date/Time: 04/10/2013 2:11 AM Performed by: Molli Hazard Pre-anesthesia Checklist: Patient identified, Emergency Drugs available, Suction available and Patient being monitored Patient Re-evaluated:Patient Re-evaluated prior to inductionOxygen Delivery Method: Circle system utilized Preoxygenation: Pre-oxygenation with 100% oxygen Intubation Type: IV induction, Rapid sequence and Cricoid Pressure applied Laryngoscope Size: Miller and 2 Grade View: Grade I Tube type: Oral Tube size: 7.5 mm Number of attempts: 1 Airway Equipment and Method: Stylet Placement Confirmation: ETT inserted through vocal cords under direct vision,  positive ETCO2 and breath sounds checked- equal and bilateral Secured at: 21 cm Tube secured with: Tape Dental Injury: Teeth and Oropharynx as per pre-operative assessment

## 2013-04-10 NOTE — Anesthesia Postprocedure Evaluation (Signed)
  Anesthesia Post-op Note  Patient: Melanie Lawson  Procedure(s) Performed: Procedure(s) with comments: APPENDECTOMY LAPAROSCOPIC (N/A) - latex allergies  Patient Location: PACU  Anesthesia Type:General  Level of Consciousness: awake  Airway and Oxygen Therapy: Patient Spontanous Breathing and Patient connected to nasal cannula oxygen  Post-op Pain: mild  Post-op Assessment: Post-op Vital signs reviewed, Patient's Cardiovascular Status Stable, Respiratory Function Stable, Patent Airway and No signs of Nausea or vomiting  Post-op Vital Signs: Reviewed and stable  Complications: No apparent anesthesia complications

## 2013-04-10 NOTE — Transfer of Care (Signed)
Immediate Anesthesia Transfer of Care Note  Patient: Melanie Lawson  Procedure(s) Performed: Procedure(s) with comments: APPENDECTOMY LAPAROSCOPIC (N/A) - latex allergies  Patient Location: PACU  Anesthesia Type:General  Level of Consciousness: awake, sedated and patient cooperative  Airway & Oxygen Therapy: Patient Spontanous Breathing and Patient connected to nasal cannula oxygen  Post-op Assessment: Report given to PACU RN, Post -op Vital signs reviewed and stable and Patient moving all extremities X 4  Post vital signs: Reviewed and stable  Complications: No apparent anesthesia complications

## 2013-04-10 NOTE — Progress Notes (Signed)
Day of Surgery  Subjective: Still nauseated some and not eating, she has had some sips, but not much more.  Objective: Vital signs in last 24 hours: Temp:  [97.7 F (36.5 C)-98.3 F (36.8 C)] 98.1 F (36.7 C) (05/13 0636) Lawson Rate:  [69-117] 102 (05/13 0636) Resp:  [13-20] 18 (05/13 0636) BP: (95-121)/(61-85) 102/69 mmHg (05/13 0636) SpO2:  [94 %-100 %] 100 % (05/13 0636) Weight:  [63.504 kg (140 lb)] 63.504 kg (140 lb) (05/13 0357)  Diet: clears, afebrile, VSS, no labs,  Intake/Output from previous day: 05/12 0701 - 05/13 0700 In: 1000 [I.V.:1000] Out: 100 [Urine:100] Intake/Output this shift:    General appearance: alert, cooperative and no distress GI: tender, incisions look fine, no distension, few BS.  Lab Results:   Recent Labs  04/09/13 1957  WBC 14.9*  HGB 15.1*  HCT 41.5  PLT 169    BMET  Recent Labs  04/09/13 1957  NA 140  K 3.3*  CL 103  CO2 27  GLUCOSE 110*  BUN 6  CREATININE 0.82  CALCIUM 10.0   PT/INR No results found for this basename: LABPROT, INR,  in the last 72 hours   Recent Labs Lab 04/09/13 1957  AST 20  ALT 17  ALKPHOS 63  BILITOT 0.7  PROT 7.1  ALBUMIN 4.8     Lipase  No results found for this basename: lipase     Studies/Results: Ct Abdomen Pelvis W Contrast  04/10/2013  *RADIOLOGY REPORT*  Clinical Data: Abdominal pain  CT ABDOMEN AND PELVIS WITH CONTRAST  Technique:  Multidetector CT imaging of the abdomen and pelvis was performed following the standard protocol during bolus administration of intravenous contrast.  Contrast: OMNIPAQUE IOHEXOL 300 MG/ML  SOLN  Comparison: None.  Findings: Limited images through the lung bases demonstrate no significant appreciable abnormality. The heart size is within normal limits. No pleural or pericardial effusion.  Unremarkable liver, biliary system, spleen, adrenal glands.  Symmetric renal enhancement.  No hydronephrosis or hydroureter.  No CT evidence for colitis.  Small  bowel loops are normal course and caliber.  Normal caliber vasculature.  Appendix is dilated with periappendiceal fat stranding and ill- defined fluid.  Measures up to 8 mm in diameter.  Reactive size ileocolic lymph nodes.  No loculated fluid collection or free intraperitoneal air.  Thin-walled bladder.  Unremarkable CT appearance to the uterus and adnexa.  No acute osseous finding.  IMPRESSION: Acute appendicitis.  Discussed via telephone with Dr. Norlene Campbell at 12:10 a.m. on 04/10/2013.   Original Report Authenticated By: Jearld Lesch, M.D.     Medications: . HYDROmorphone      . HYDROmorphone      . piperacillin-tazobactam (ZOSYN)  IV  3.375 g Intravenous Q8H    Assessment/Plan Acute appendicitis, s/p Laparoscopic appendectomy Surgeon: Dr. Harden Mo, 04/10/2013   Plan:  I will let her advance her diet as she tolerates it.  If she does well we can let her go home later today, if not tomorrow.         LOS: 1 day    Melanie Lawson 04/10/2013

## 2013-04-10 NOTE — Progress Notes (Signed)
Patient seen and examined.  Will not be ready for discharge today.

## 2013-04-11 ENCOUNTER — Encounter (HOSPITAL_COMMUNITY): Payer: Self-pay | Admitting: General Surgery

## 2013-04-11 LAB — COMPREHENSIVE METABOLIC PANEL
ALT: 11 U/L (ref 0–35)
AST: 17 U/L (ref 0–37)
Albumin: 3.2 g/dL — ABNORMAL LOW (ref 3.5–5.2)
Alkaline Phosphatase: 50 U/L (ref 39–117)
BUN: 5 mg/dL — ABNORMAL LOW (ref 6–23)
CO2: 27 mEq/L (ref 19–32)
Calcium: 8.8 mg/dL (ref 8.4–10.5)
Chloride: 104 mEq/L (ref 96–112)
Creatinine, Ser: 0.88 mg/dL (ref 0.50–1.10)
GFR calc Af Amer: >90 mL/min (ref >90)
GFR calc non Af Amer: >90 mL/min (ref >90)
Glucose, Bld: 97 mg/dL (ref 70–99)
Potassium: 3.4 mEq/L — ABNORMAL LOW (ref 3.5–5.1)
Sodium: 138 mEq/L (ref 135–145)
Total Bilirubin: 0.4 mg/dL (ref 0.3–1.2)
Total Protein: 6 g/dL (ref 6.0–8.3)

## 2013-04-11 LAB — CBC
HCT: 37.5 % (ref 36.0–46.0)
Hemoglobin: 12.9 g/dL (ref 12.0–15.0)
MCH: 31 pg (ref 26.0–34.0)
MCHC: 34.4 g/dL (ref 30.0–36.0)
MCV: 90.1 fL (ref 78.0–100.0)
Platelets: 152 10*3/uL (ref 150–400)
RBC: 4.16 MIL/uL (ref 3.87–5.11)
RDW: 12 % (ref 11.5–15.5)
WBC: 7.6 10*3/uL (ref 4.0–10.5)

## 2013-04-11 MED ORDER — OXYCODONE HCL 5 MG PO TABS: 5.0000 mg | ORAL_TABLET | ORAL | Status: DC | PRN

## 2013-04-11 NOTE — Discharge Summary (Signed)
Patient ID: Melanie Lawson MRN: [id number] DOB/AGE: January 07, 1991 21 y.o.  Admit date: 04/09/2013 Discharge date: 04/11/2013  Procedures: Laparoscopic appendectomy  Consults: None  Reason for Admission: Acute appendicitis   Admission Diagnoses: Acute appendicitis  Hospital Course: [name] is a 22 yo female who presented to [facility] with RLQ abdominal pain.  Her pain progressively got worse over a course of less than 24 hours, which is what brought her to the ED.  She denied fever, nausea and vomiting.  CT scan with contrast of the abdomen and pelvis revealed acute appendicitis.  A laparoscopic appendectomy was performed emergently by Dr. Dwain Sarna.  Pt tolerated the procedure well with no complications.  Diet was tolerated.  On POD1, pt was able to tolerate a solid diet, void without difficulty and ambulate without significant pain.  Incisions were clean, dry and intact.  Pt was provided with educational materials on her diagnosis as well as activity restrictions.  Patient was instructed to follow-up with our office in two weeks and to call with any questions or concerns.    Discharge Diagnoses:  Acute appendicitis, S/P lap appy  Discharge Medications:   Medication List    TAKE these medications       naphazoline-pheniramine 0.025-0.3 % ophthalmic solution  Commonly known as:  NAPHCON-A  Place 1 drop into both eyes 4 (four) times daily as needed (for dry eyes).     naproxen sodium 220 MG tablet  Commonly known as:  ANAPROX  Take 220 mg by mouth 2 (two) times daily with a meal.     oxyCODONE 5 MG immediate release tablet  Commonly known as:  Oxy IR/ROXICODONE  Take 1 tablet (5 mg total) by mouth every 4 (four) hours as needed.        Discharge Instructions: Follow-up Information   Follow up with [facility] On 04/24/2013. (12:45pm, arrive at 12:15pm)    Contact information:   [address]   [place] [address] [phone]        Signed: Cecile Hearing 04/11/2013, 9:11 AM

## 2013-04-11 NOTE — Progress Notes (Signed)
Patient discharged to home with family.  Discharge instructions given including follow up care, medications, diet, activity and signs and symptoms of infection.  Verbalizes understanding with no further questions.  Tolerating diet without complaints of nausea.  Vital signs stable.  Discharged with family.

## 2013-04-11 NOTE — Discharge Summary (Signed)
Agree with summary. 

## 2013-04-11 NOTE — Progress Notes (Signed)
1 Day Post-Op  Subjective: Feels much better today.  Ordered pancakes for breakfast.  Objective: Vital signs in last 24 hours: Temp:  [97.4 F (36.3 C)-98.5 F (36.9 C)] 98 F (36.7 C) (05/14 0637) Pulse Rate:  [64-91] 88 (05/14 0637) Resp:  [16-19] 16 (05/14 0637) BP: (98-110)/(65-77) 101/70 mmHg (05/14 0637) SpO2:  [98 %-100 %] 99 % (05/14 0637)    Intake/Output from previous day: 05/13 0701 - 05/14 0700 In: 1284 [I.V.:1209; IV Piggyback:75] Out: 900 [Urine:900] Intake/Output this shift:    PE: General- In NAD Abdomen-soft, incisions clean and intact  Lab Results:   Recent Labs  04/09/13 1957 04/11/13 0510  WBC 14.9* 7.6  HGB 15.1* 12.9  HCT 41.5 37.5  PLT 169 152   BMET  Recent Labs  04/09/13 1957 04/11/13 0510  NA 140 138  K 3.3* 3.4*  CL 103 104  CO2 27 27  GLUCOSE 110* 97  BUN 6 5*  CREATININE 0.82 0.88  CALCIUM 10.0 8.8   PT/INR No results found for this basename: LABPROT, INR,  in the last 72 hours Comprehensive Metabolic Panel:    Component Value Date/Time   NA 138 04/11/2013 0510   K 3.4* 04/11/2013 0510   CL 104 04/11/2013 0510   CO2 27 04/11/2013 0510   BUN 5* 04/11/2013 0510   CREATININE 0.88 04/11/2013 0510   GLUCOSE 97 04/11/2013 0510   CALCIUM 8.8 04/11/2013 0510   AST 17 04/11/2013 0510   ALT 11 04/11/2013 0510   ALKPHOS 50 04/11/2013 0510   BILITOT 0.4 04/11/2013 0510   PROT 6.0 04/11/2013 0510   ALBUMIN 3.2* 04/11/2013 0510     Studies/Results: Ct Abdomen Pelvis W Contrast  04/10/2013   *RADIOLOGY REPORT*  Clinical Data: Abdominal pain  CT ABDOMEN AND PELVIS WITH CONTRAST  Technique:  Multidetector CT imaging of the abdomen and pelvis was performed following the standard protocol during bolus administration of intravenous contrast.  Contrast: OMNIPAQUE IOHEXOL 300 MG/ML  SOLN  Comparison: None.  Findings: Limited images through the lung bases demonstrate no significant appreciable abnormality. The heart size is within normal  limits. No pleural or pericardial effusion.  Unremarkable liver, biliary system, spleen, adrenal glands.  Symmetric renal enhancement.  No hydronephrosis or hydroureter.  No CT evidence for colitis.  Small bowel loops are normal course and caliber.  Normal caliber vasculature.  Appendix is dilated with periappendiceal fat stranding and ill- defined fluid.  Measures up to 8 mm in diameter.  Reactive size ileocolic lymph nodes.  No loculated fluid collection or free intraperitoneal air.  Thin-walled bladder.  Unremarkable CT appearance to the uterus and adnexa.  No acute osseous finding.  IMPRESSION: Acute appendicitis.  Discussed via telephone with Dr. Norlene Campbell at 12:10 a.m. on 04/10/2013.   Original Report Authenticated By: Jearld Lesch, M.D.    Anti-infectives: Anti-infectives   Start     Dose/Rate Route Frequency Ordered Stop   04/10/13 1000  piperacillin-tazobactam (ZOSYN) IVPB 3.375 g     3.375 g 12.5 mL/hr over 240 Minutes Intravenous Every 8 hours 04/10/13 0408     04/10/13 0130  piperacillin-tazobactam (ZOSYN) IVPB 3.375 g     3.375 g 12.5 mL/hr over 240 Minutes Intravenous  Once 04/10/13 0115 04/10/13 0201      Assessment Acute appendicitis, s/p Laparoscopic appendectomy Surgeon: Dr. Harden Mo, 04/10/2013-doing significantly better today.   LOS: 2 days   Plan: Discharge if she is able to tolerate breakfast.  Instructions given.   Melanie Lawson [name]  04/11/2013   

## 2013-04-24 ENCOUNTER — Ambulatory Visit (INDEPENDENT_AMBULATORY_CARE_PROVIDER_SITE_OTHER): Payer: 59 | Admitting: Internal Medicine

## 2013-04-24 ENCOUNTER — Encounter (INDEPENDENT_AMBULATORY_CARE_PROVIDER_SITE_OTHER): Payer: Self-pay

## 2013-04-24 VITALS — BP 98/66 | HR 64 | Temp 98.2°F | Resp 14 | Ht 66.0 in | Wt 140.4 lb

## 2013-04-24 DIAGNOSIS — K3580 Unspecified acute appendicitis: Secondary | ICD-10-CM

## 2013-04-24 NOTE — Progress Notes (Signed)
  Subjective: Pt returns to the clinic today after undergoing laparoscopic appendectomy on 04/10/13 by Dr. Dwain Sarna.  The patient is tolerating their diet well and is having no severe pain.  Bowel function is good.  No problems with the wounds.  Objective: Vital signs in last 24 hours: Reviewed  PE: Abd: soft, non-tender, +bs, incisions well healed  Lab Results:  No results found for this basename: WBC, HGB, HCT, PLT,  in the last 72 hours BMET No results found for this basename: NA, K, CL, CO2, GLUCOSE, BUN, CREATININE, CALCIUM,  in the last 72 hours PT/INR No results found for this basename: LABPROT, INR,  in the last 72 hours CMP     Component Value Date/Time   NA 138 04/11/2013 0510   K 3.4* 04/11/2013 0510   CL 104 04/11/2013 0510   CO2 27 04/11/2013 0510   GLUCOSE 97 04/11/2013 0510   BUN 5* 04/11/2013 0510   CREATININE 0.88 04/11/2013 0510   CALCIUM 8.8 04/11/2013 0510   PROT 6.0 04/11/2013 0510   ALBUMIN 3.2* 04/11/2013 0510   AST 17 04/11/2013 0510   ALT 11 04/11/2013 0510   ALKPHOS 50 04/11/2013 0510   BILITOT 0.4 04/11/2013 0510   GFRNONAA >90 04/11/2013 0510   GFRAA >90 04/11/2013 0510   Lipase  No results found for this basename: lipase       Studies/Results: No results found.  Anti-infectives: Anti-infectives   None       Assessment/Plan  1.  S/P Laparoscopic Appendectomy: doing well, may resume regular activity without restrictions, Pt will follow up with [place] [facility] and knows to call with questions or concerns.     Melanie Lawson 04/24/2013

## 2013-04-24 NOTE — Patient Instructions (Addendum)
May resume regular activity without restrictions. Follow up as needed. Call with questions or concerns.  

## 2014-05-29 IMAGING — CT CT ABD-PELV W/ CM
2 of 4 series · 17 of 46 positions shown, 19 images · IV contrast (APPLIED)
Comparison: None.

CLINICAL DATA: Abdominal pain

CT ABDOMEN AND PELVIS WITH CONTRAST
TECHNIQUE: Multidetector CT imaging of the abdomen and pelvis was
performed following the standard protocol during bolus
administration of intravenous contrast.
Contrast: 100mL OMNIPAQUE IOHEXOL 300 MG/ML  SOLN

[Series 2: abd/pelv with 5.0 b31f st · axial · 0.60mm/px · z∈[+638,+1048]mm · 14 of 90 slices shown, 16 images]
[im 4/90  soft-tissue]
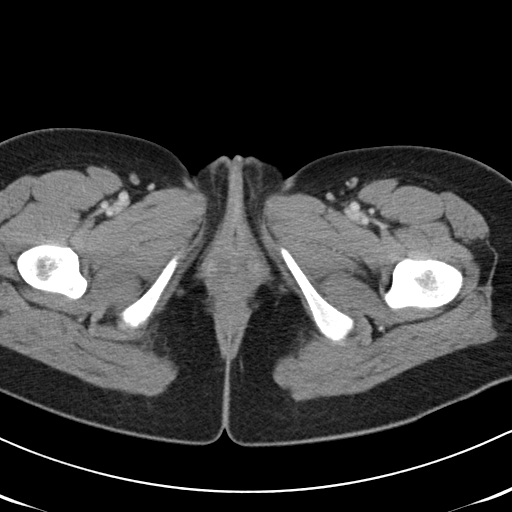
[im 4/90  bone]
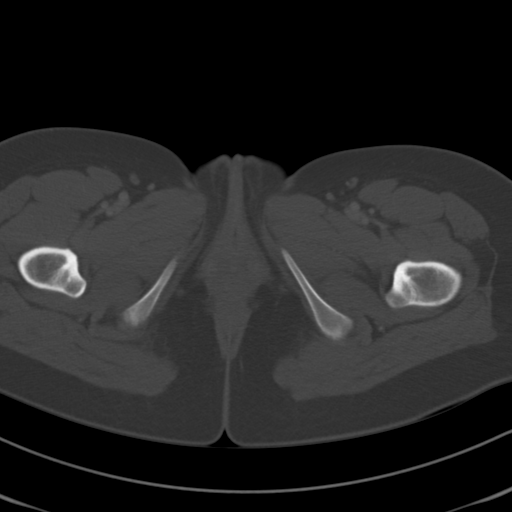
[im 12/90  soft-tissue]
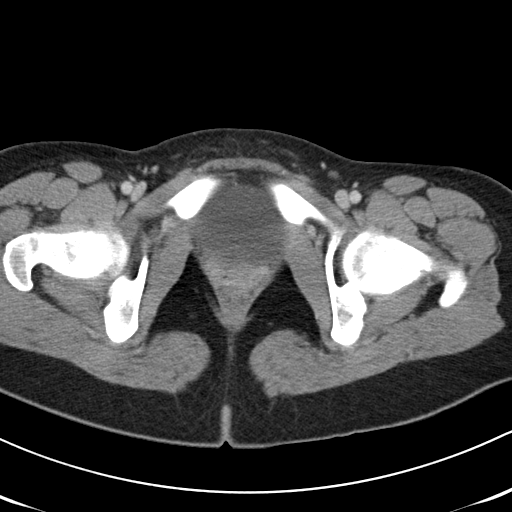
[im 19/90  soft-tissue]
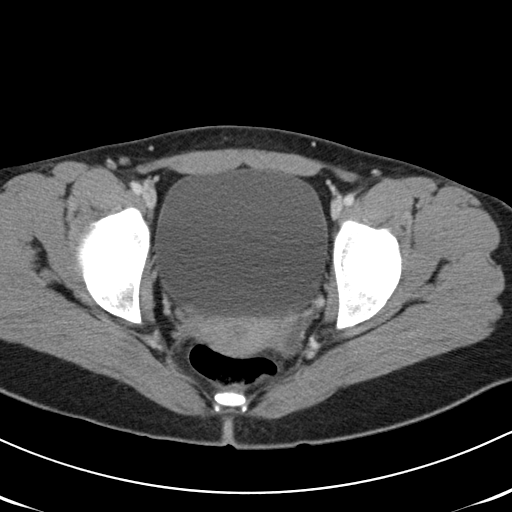
[im 23/90  soft-tissue]
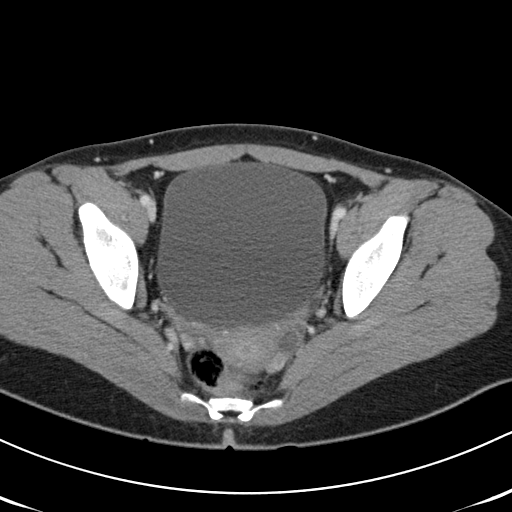
[im 30/90  soft-tissue]
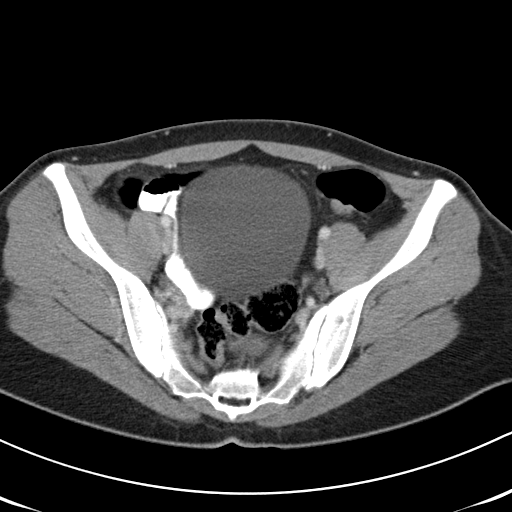
[im 38/90  soft-tissue]
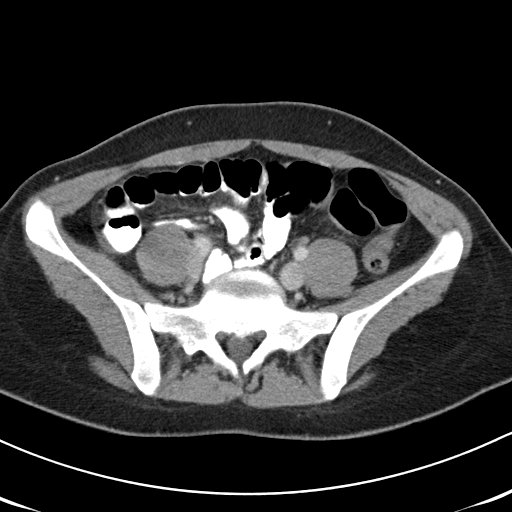
[im 41/90  soft-tissue]
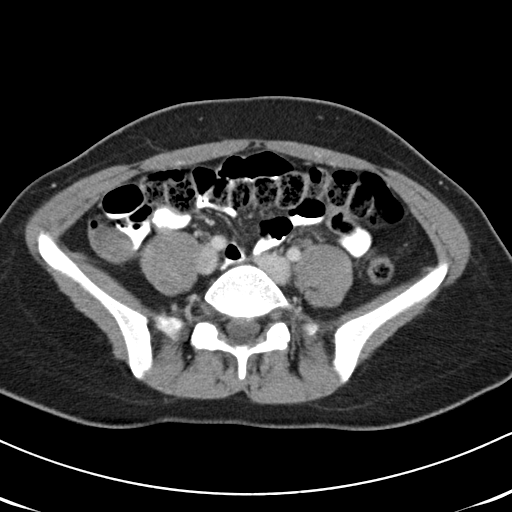
[im 49/90  soft-tissue]
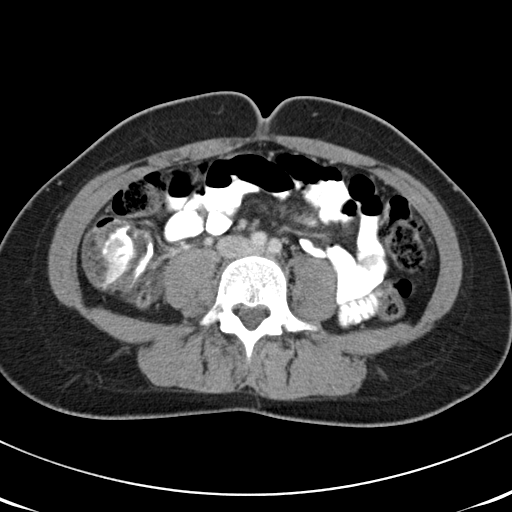
[im 52/90  soft-tissue]
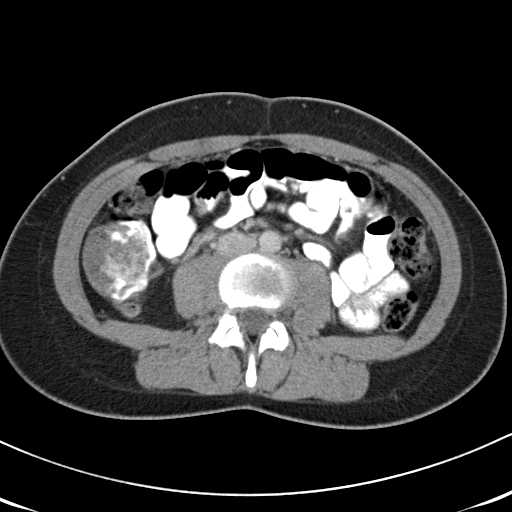
[im 52/90  bone]
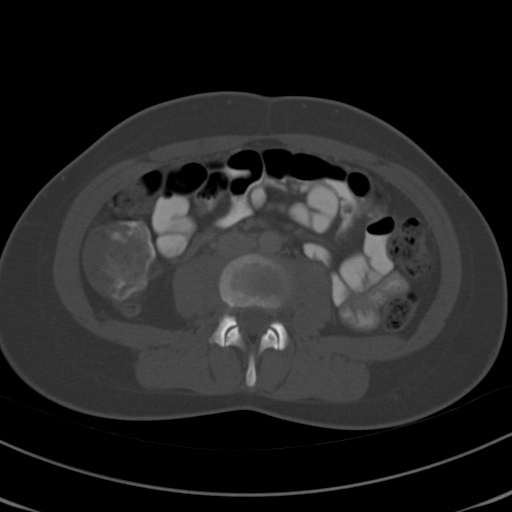
[im 60/90  soft-tissue]
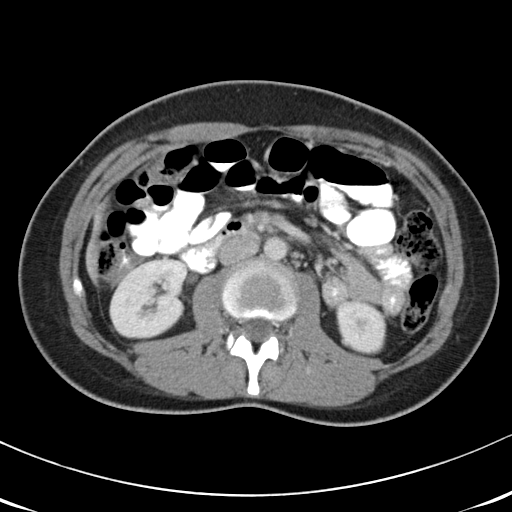
[im 67/90  soft-tissue]
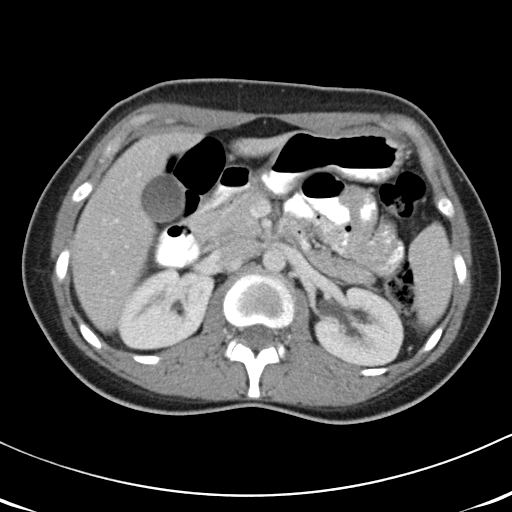
[im 71/90  soft-tissue]
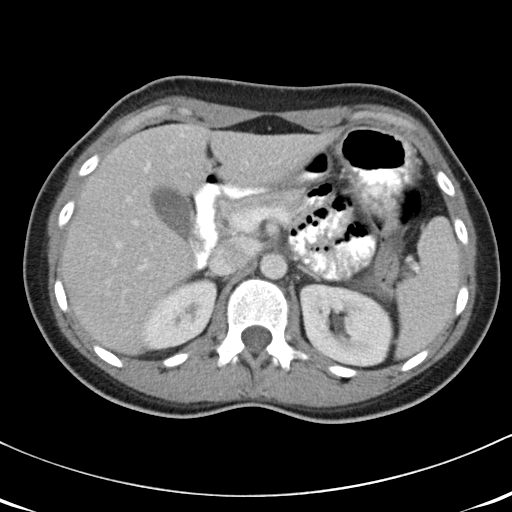
[im 78/90  soft-tissue]
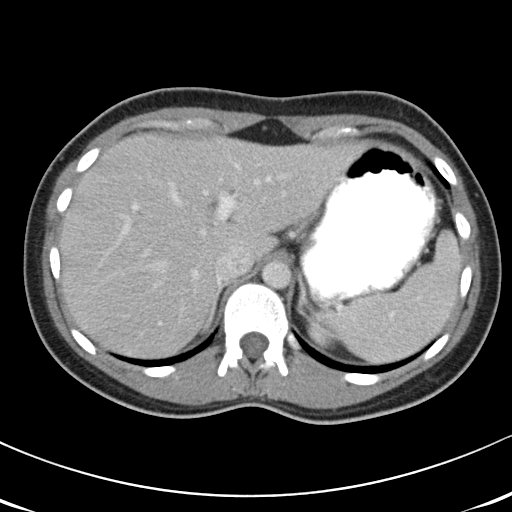
[im 86/90  soft-tissue]
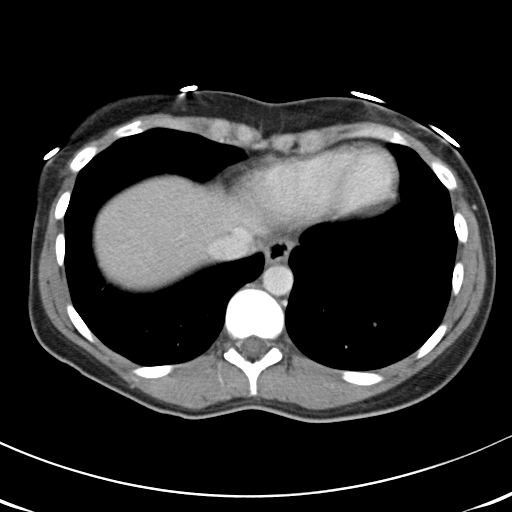

[Series 602: cor · coronal · 0.87mm/px · 3 of 103 slices shown]
[im 35/103  soft-tissue]
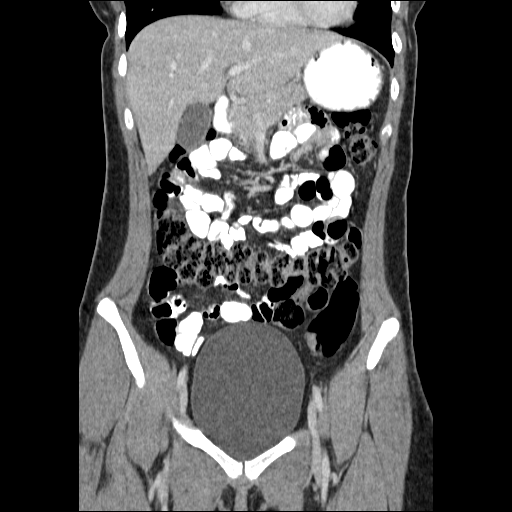
[im 46/103  soft-tissue]
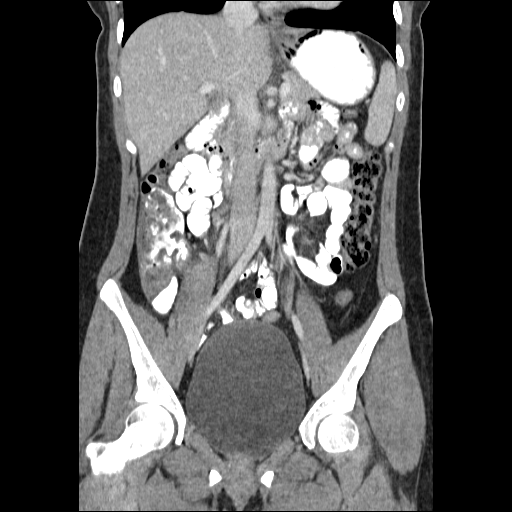
[im 57/103  soft-tissue]
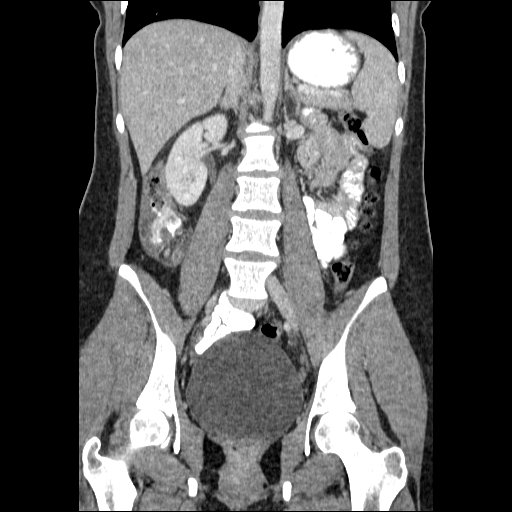

[17 of 46 positions shown; findings below may reference images not displayed]

FINDINGS: Limited images through the lung bases demonstrate no
significant appreciable abnormality. The heart size is within
normal limits. No pleural or pericardial effusion.

Unremarkable liver, biliary system, spleen, adrenal glands..

Symmetric renal enhancement.  No hydronephrosis or hydroureter.

No CT evidence for colitis.  Small bowel loops are normal course
and caliber.  Normal caliber vasculature.

Appendix is dilated with periappendiceal fat stranding and ill-
defined fluid.  Measures up to 8 mm in diameter.

Reactive size ileocolic lymph nodes.  No loculated fluid collection
or free intraperitoneal air.

Thin-walled bladder.  Unremarkable CT appearance to the uterus and
adnexa.

No acute osseous finding.
IMPRESSION: Acute appendicitis.

Discussed via telephone with Dr. Bruck at [DATE] a.m. on 04/10/2013.

## 2016-03-30 DIAGNOSIS — Z3169 Encounter for other general counseling and advice on procreation: Secondary | ICD-10-CM | POA: Diagnosis not present

## 2016-07-28 DIAGNOSIS — Z01419 Encounter for gynecological examination (general) (routine) without abnormal findings: Secondary | ICD-10-CM | POA: Diagnosis not present

## 2016-07-28 DIAGNOSIS — Z6823 Body mass index (BMI) 23.0-23.9, adult: Secondary | ICD-10-CM | POA: Diagnosis not present

## 2016-09-06 DIAGNOSIS — N911 Secondary amenorrhea: Secondary | ICD-10-CM | POA: Diagnosis not present

## 2016-09-17 DIAGNOSIS — Z3A09 9 weeks gestation of pregnancy: Secondary | ICD-10-CM | POA: Diagnosis not present

## 2016-09-17 DIAGNOSIS — Z3401 Encounter for supervision of normal first pregnancy, first trimester: Secondary | ICD-10-CM | POA: Diagnosis not present

## 2016-09-17 DIAGNOSIS — Z3480 Encounter for supervision of other normal pregnancy, unspecified trimester: Secondary | ICD-10-CM | POA: Diagnosis not present

## 2016-09-17 DIAGNOSIS — Z23 Encounter for immunization: Secondary | ICD-10-CM | POA: Diagnosis not present

## 2016-09-17 DIAGNOSIS — Z3402 Encounter for supervision of normal first pregnancy, second trimester: Secondary | ICD-10-CM | POA: Diagnosis not present

## 2016-09-17 LAB — OB RESULTS CONSOLE GC/CHLAMYDIA
Chlamydia: NEGATIVE
Gonorrhea: NEGATIVE

## 2016-09-17 LAB — OB RESULTS CONSOLE ABO/RH: RH Type: POSITIVE

## 2016-09-17 LAB — OB RESULTS CONSOLE RUBELLA ANTIBODY, IGM: Rubella: IMMUNE

## 2016-09-17 LAB — OB RESULTS CONSOLE HIV ANTIBODY (ROUTINE TESTING): HIV: NONREACTIVE

## 2016-09-17 LAB — OB RESULTS CONSOLE ANTIBODY SCREEN: Antibody Screen: NEGATIVE

## 2016-09-17 LAB — OB RESULTS CONSOLE HEPATITIS B SURFACE ANTIGEN: Hepatitis B Surface Ag: NEGATIVE

## 2016-09-17 LAB — OB RESULTS CONSOLE RPR: RPR: NONREACTIVE

## 2016-09-30 DIAGNOSIS — Z113 Encounter for screening for infections with a predominantly sexual mode of transmission: Secondary | ICD-10-CM | POA: Diagnosis not present

## 2016-09-30 DIAGNOSIS — Z3401 Encounter for supervision of normal first pregnancy, first trimester: Secondary | ICD-10-CM | POA: Diagnosis not present

## 2016-11-01 DIAGNOSIS — Z3401 Encounter for supervision of normal first pregnancy, first trimester: Secondary | ICD-10-CM | POA: Diagnosis not present

## 2016-11-01 DIAGNOSIS — Z3402 Encounter for supervision of normal first pregnancy, second trimester: Secondary | ICD-10-CM | POA: Diagnosis not present

## 2016-12-01 DIAGNOSIS — Z363 Encounter for antenatal screening for malformations: Secondary | ICD-10-CM | POA: Diagnosis not present

## 2017-01-27 DIAGNOSIS — Z3480 Encounter for supervision of other normal pregnancy, unspecified trimester: Secondary | ICD-10-CM | POA: Diagnosis not present

## 2017-02-15 DIAGNOSIS — Z23 Encounter for immunization: Secondary | ICD-10-CM | POA: Diagnosis not present

## 2017-03-24 DIAGNOSIS — Z3685 Encounter for antenatal screening for Streptococcus B: Secondary | ICD-10-CM | POA: Diagnosis not present

## 2017-03-24 DIAGNOSIS — Z36 Encounter for antenatal screening for chromosomal anomalies: Secondary | ICD-10-CM | POA: Diagnosis not present

## 2017-03-29 ENCOUNTER — Inpatient Hospital Stay (HOSPITAL_COMMUNITY): Admission: AD | Admit: 2017-03-29 | Payer: 59 | Source: Ambulatory Visit | Admitting: Obstetrics and Gynecology

## 2017-04-21 DIAGNOSIS — O3663X0 Maternal care for excessive fetal growth, third trimester, not applicable or unspecified: Secondary | ICD-10-CM | POA: Diagnosis not present

## 2017-04-21 DIAGNOSIS — Z3A39 39 weeks gestation of pregnancy: Secondary | ICD-10-CM | POA: Diagnosis not present

## 2017-04-27 ENCOUNTER — Encounter (HOSPITAL_COMMUNITY): Payer: Self-pay | Admitting: *Deleted

## 2017-04-27 ENCOUNTER — Telehealth (HOSPITAL_COMMUNITY): Payer: Self-pay | Admitting: *Deleted

## 2017-04-27 LAB — OB RESULTS CONSOLE GBS: GBS: NEGATIVE

## 2017-04-27 NOTE — Telephone Encounter (Signed)
Preadmission screen  

## 2017-04-27 NOTE — H&P (Signed)
Thayer HeadingsShannon F Wakefield is a 26 y.o. female presenting for post dates IOL. U/S 04/21/17 EFW=9#, VTX. OB History    Gravida Para Term Preterm AB Living   1             SAB TAB Ectopic Multiple Live Births                 Past Medical History:  Diagnosis Date  . PONV (postoperative nausea and vomiting)    Past Surgical History:  Procedure Laterality Date  . LAPAROSCOPIC APPENDECTOMY N/A 04/10/2013   Procedure: APPENDECTOMY LAPAROSCOPIC;  Surgeon: Emelia LoronMatthew Wakefield, MD;  Location: [facility] OR;  Service: General;  Laterality: N/A;  latex allergies   Family History: family history includes Melanoma in her paternal uncle; Stroke in her maternal grandfather and maternal grandmother. Social History:  reports that she has never smoked. She has never used smokeless tobacco. She reports that she drinks alcohol. She reports that she does not use drugs.     Maternal Diabetes: No Genetic Screening: Declined Maternal Ultrasounds/Referrals: Normal Fetal Ultrasounds or other Referrals:  None Maternal Substance Abuse:  No Significant Maternal Medications:  None Significant Maternal Lab Results:  None Other Comments:  None  Review of Systems  Eyes: Negative for blurred vision.  Gastrointestinal: Negative for abdominal pain.  Neurological: Negative for headaches.   History   Last menstrual period 07/16/2016. Maternal Exam:  Abdomen: Fetal presentation: vertex     Physical Exam  Cardiovascular: Normal rate.   Respiratory: Effort normal.  GI: Soft.    Prenatal labs: ABO, Rh: B/Positive/-- (10/20 0000) Antibody: Negative (10/20 0000) Rubella: Immune (10/20 0000) RPR: Nonreactive (10/20 0000)  HBsAg: Negative (10/20 0000)  HIV: Non-reactive (10/20 0000)  GBS: Negative (05/30 0000)   Assessment/Plan: 26 yo G1P0 @ 40 6/7 weeks for IOL   Caden Fukushima II,Rilyn Upshaw E 04/27/2017, 12:33 PM

## 2017-04-28 ENCOUNTER — Inpatient Hospital Stay (HOSPITAL_COMMUNITY)
Admission: RE | Admit: 2017-04-28 | Discharge: 2017-04-30 | DRG: 775 | Disposition: A | Discharge status: Home / Self Care | Payer: BLUE CROSS/BLUE SHIELD | Source: Ambulatory Visit | Attending: Obstetrics and Gynecology | Admitting: Obstetrics and Gynecology

## 2017-04-28 ENCOUNTER — Encounter (HOSPITAL_COMMUNITY): Payer: Self-pay

## 2017-04-28 DIAGNOSIS — Z3A40 40 weeks gestation of pregnancy: Secondary | ICD-10-CM

## 2017-04-28 DIAGNOSIS — O480 Post-term pregnancy: Secondary | ICD-10-CM | POA: Diagnosis present

## 2017-04-28 LAB — TYPE AND SCREEN
ABO/RH(D): B POS
Antibody Screen: NEGATIVE

## 2017-04-28 LAB — RPR: RPR Ser Ql: NONREACTIVE

## 2017-04-28 LAB — CBC
HCT: 37.5 % (ref 36.0–46.0)
Hemoglobin: 12.8 g/dL (ref 12.0–15.0)
MCH: 30.5 pg (ref 26.0–34.0)
MCHC: 34.1 g/dL (ref 30.0–36.0)
MCV: 89.5 fL (ref 78.0–100.0)
Platelets: 138 10*3/uL — ABNORMAL LOW (ref 150–400)
RBC: 4.19 MIL/uL (ref 3.87–5.11)
RDW: 13.5 % (ref 11.5–15.5)
WBC: 7 10*3/uL (ref 4.0–10.5)

## 2017-04-28 LAB — ABO/RH: ABO/RH(D): B POS

## 2017-04-28 MED ORDER — ACETAMINOPHEN 325 MG PO TABS: 650.0000 mg | ORAL_TABLET | ORAL | Status: DC | PRN

## 2017-04-28 MED ORDER — LIDOCAINE HCL (PF) 1 % IJ SOLN
30.0000 mL | INTRAMUSCULAR | Status: DC | PRN
  Administered 2017-04-28: 30 mL via SUBCUTANEOUS
  Filled 2017-04-28: qty 30

## 2017-04-28 MED ORDER — OXYTOCIN 40 UNITS IN LACTATED RINGERS INFUSION - SIMPLE MED
1.0000 m[IU]/min | INTRAVENOUS | Status: DC
  Administered 2017-04-28: 2 m[IU]/min via INTRAVENOUS

## 2017-04-28 MED ORDER — OXYTOCIN BOLUS FROM INFUSION
500.0000 mL | Freq: Once | INTRAVENOUS | Status: AC
  Administered 2017-04-28: 500 mL via INTRAVENOUS

## 2017-04-28 MED ORDER — OXYCODONE HCL 5 MG PO TABS: 5.0000 mg | ORAL_TABLET | ORAL | Status: DC | PRN

## 2017-04-28 MED ORDER — OXYCODONE-ACETAMINOPHEN 5-325 MG PO TABS: 2.0000 | ORAL_TABLET | ORAL | Status: DC | PRN

## 2017-04-28 MED ORDER — DIBUCAINE 1 % RE OINT: 1.0000 "application " | TOPICAL_OINTMENT | RECTAL | Status: DC | PRN

## 2017-04-28 MED ORDER — DIPHENHYDRAMINE HCL 25 MG PO CAPS: 25.0000 mg | ORAL_CAPSULE | Freq: Four times a day (QID) | ORAL | Status: DC | PRN

## 2017-04-28 MED ORDER — FLEET ENEMA 7-19 GM/118ML RE ENEM: 1.0000 | ENEMA | RECTAL | Status: DC | PRN

## 2017-04-28 MED ORDER — SOD CITRATE-CITRIC ACID 500-334 MG/5ML PO SOLN: 30.0000 mL | ORAL | Status: DC | PRN

## 2017-04-28 MED ORDER — ONDANSETRON HCL 4 MG PO TABS: 4.0000 mg | ORAL_TABLET | ORAL | Status: DC | PRN

## 2017-04-28 MED ORDER — SIMETHICONE 80 MG PO CHEW: 80.0000 mg | CHEWABLE_TABLET | ORAL | Status: DC | PRN

## 2017-04-28 MED ORDER — OXYTOCIN 40 UNITS IN LACTATED RINGERS INFUSION - SIMPLE MED
2.5000 [IU]/h | INTRAVENOUS | Status: DC
  Filled 2017-04-28: qty 1000

## 2017-04-28 MED ORDER — IBUPROFEN 600 MG PO TABS
600.0000 mg | ORAL_TABLET | Freq: Four times a day (QID) | ORAL | Status: DC
  Administered 2017-04-28 – 2017-04-30 (×6): 600 mg via ORAL
  Filled 2017-04-28 (×6): qty 1

## 2017-04-28 MED ORDER — SENNOSIDES-DOCUSATE SODIUM 8.6-50 MG PO TABS
2.0000 | ORAL_TABLET | ORAL | Status: DC
  Administered 2017-04-28 – 2017-04-29 (×2): 2 via ORAL
  Filled 2017-04-28 (×2): qty 2

## 2017-04-28 MED ORDER — LACTATED RINGERS IV SOLN
INTRAVENOUS | Status: DC
  Administered 2017-04-28: 08:00:00 via INTRAVENOUS

## 2017-04-28 MED ORDER — BUTORPHANOL TARTRATE 1 MG/ML IJ SOLN
1.0000 mg | INTRAMUSCULAR | Status: DC | PRN
  Administered 2017-04-28 (×2): 1 mg via INTRAVENOUS
  Filled 2017-04-28 (×2): qty 1

## 2017-04-28 MED ORDER — TETANUS-DIPHTH-ACELL PERTUSSIS 5-2.5-18.5 LF-MCG/0.5 IM SUSP: 0.5000 mL | Freq: Once | INTRAMUSCULAR | Status: DC

## 2017-04-28 MED ORDER — OXYCODONE HCL 5 MG PO TABS
10.0000 mg | ORAL_TABLET | ORAL | Status: DC | PRN
Start: 2017-04-28 — End: 2017-04-30

## 2017-04-28 MED ORDER — TERBUTALINE SULFATE 1 MG/ML IJ SOLN
0.2500 mg | Freq: Once | INTRAMUSCULAR | Status: DC | PRN
  Filled 2017-04-28: qty 1

## 2017-04-28 MED ORDER — LACTATED RINGERS IV SOLN: 500.0000 mL | INTRAVENOUS | Status: DC | PRN

## 2017-04-28 MED ORDER — OXYCODONE-ACETAMINOPHEN 5-325 MG PO TABS: 1.0000 | ORAL_TABLET | ORAL | Status: DC | PRN

## 2017-04-28 MED ORDER — WITCH HAZEL-GLYCERIN EX PADS: 1.0000 "application " | MEDICATED_PAD | CUTANEOUS | Status: DC | PRN

## 2017-04-28 MED ORDER — ONDANSETRON HCL 4 MG/2ML IJ SOLN: 4.0000 mg | Freq: Four times a day (QID) | INTRAMUSCULAR | Status: DC | PRN

## 2017-04-28 MED ORDER — ZOLPIDEM TARTRATE 5 MG PO TABS: 5.0000 mg | ORAL_TABLET | Freq: Every evening | ORAL | Status: DC | PRN

## 2017-04-28 MED ORDER — PRENATAL MULTIVITAMIN CH
1.0000 | ORAL_TABLET | Freq: Every day | ORAL | Status: DC
  Administered 2017-04-29: 1 via ORAL
  Filled 2017-04-28: qty 1

## 2017-04-28 MED ORDER — ONDANSETRON HCL 4 MG/2ML IJ SOLN: 4.0000 mg | INTRAMUSCULAR | Status: DC | PRN

## 2017-04-28 MED ORDER — COCONUT OIL OIL
1.0000 "application " | TOPICAL_OIL | Status: DC | PRN
  Administered 2017-04-29: 1 via TOPICAL
  Filled 2017-04-28: qty 120

## 2017-04-28 MED ORDER — BENZOCAINE-MENTHOL 20-0.5 % EX AERO
1.0000 "application " | INHALATION_SPRAY | CUTANEOUS | Status: DC | PRN
  Filled 2017-04-28: qty 56

## 2017-04-28 NOTE — Anesthesia Pain Management Evaluation Note (Signed)
  CRNA Pain Management Visit Note  Patient: Melanie RiddleShannon M Millhouse, 26 y.o., female  "Hello I am a member of the anesthesia team at [facility]. We have an anesthesia team available at all times to provide care throughout the hospital, including epidural management and anesthesia for C-section. I don't know your plan for the delivery whether it a natural birth, water birth, IV sedation, nitrous supplementation, doula or epidural, but we want to meet your pain goals."   1.Was your pain managed to your expectations on prior hospitalizations?   No prior hospitalizations  2.What is your expectation for pain management during this hospitalization?     Labor support without medications  3.How can we help you reach that goal?   Record the patient's initial score and the patient's pain goal.   Pain: 0  Pain Goal: 8 [facility] wants you to be able to say your pain was always managed very well.  Laban EmperorMalinova,Shashwat Cleary Hristova 04/28/2017

## 2017-04-28 NOTE — Progress Notes (Signed)
FHT cat one UCs irregular Cx no change Continue pitocin

## 2017-04-28 NOTE — H&P (Deleted)
Melanie Lawson is a 26 y.o. female presenting for UCs. OB History    Gravida Para Term Preterm AB Living   1             SAB TAB Ectopic Multiple Live Births                 Past Medical History:  Diagnosis Date  . PONV (postoperative nausea and vomiting)    Past Surgical History:  Procedure Laterality Date  . LAPAROSCOPIC APPENDECTOMY N/A 04/10/2013   Procedure: APPENDECTOMY LAPAROSCOPIC;  Surgeon: Emelia LoronMatthew Wakefield, MD;  Location: [facility] OR;  Service: General;  Laterality: N/A;  latex allergies   Family History: family history includes Melanoma in her paternal uncle; Stroke in her maternal grandfather and maternal grandmother. Social History:  reports that she has never smoked. She has never used smokeless tobacco. She reports that she drinks alcohol. She reports that she does not use drugs.     Maternal Diabetes: No Genetic Screening: Normal Maternal Ultrasounds/Referrals: Normal Fetal Ultrasounds or other Referrals:  None Maternal Substance Abuse:  No Significant Maternal Medications:  None Significant Maternal Lab Results:  None Other Comments:  None  Review of Systems  Eyes: Negative for blurred vision.  Gastrointestinal: Negative for abdominal pain.  Neurological: Negative for headaches.   Maternal Medical History:  Contractions: Onset was 3-5 hours ago.    Fetal activity: Perceived fetal activity is normal.      Dilation: 4.5 Effacement (%): 80 Station: -2 Exam by:: m wilkins rnc  Height 5\' 8"  (1.727 m), weight 183 lb (83 kg), last menstrual period 07/16/2016. Maternal Exam:  Abdomen: Fetal presentation: vertex     Fetal Exam Fetal State Assessment: Category I - tracings are normal.     Physical Exam  Cardiovascular: Normal rate and regular rhythm.   Respiratory: Effort normal and breath sounds normal.  GI: Soft. There is no tenderness.  Neurological: She has normal reflexes.    Cx 4/C/-1/vtx AROM clear  Prenatal labs: ABO, Rh:  B/Positive/-- (10/20 0000) Antibody: Negative (10/20 0000) Rubella: Immune (10/20 0000) RPR: Nonreactive (10/20 0000)  HBsAg: Negative (10/20 0000)  HIV: Non-reactive (10/20 0000)  GBS: Negative (05/30 0000)   Assessment/Plan: 26 yo G1P0 @ 40 6/7 weeks in labor   Melanie Lawson,Melanie Lawson 04/28/2017, 9:07 AM

## 2017-04-28 NOTE — Progress Notes (Signed)
FHT cat one Pitocin running UCs irregular D/W patient above

## 2017-04-28 NOTE — Progress Notes (Signed)
FHT cat one UCs q2-4 min Cx 5/C/-1/vtx AROM clear

## 2017-04-28 NOTE — Progress Notes (Signed)
Cx 5-6/C/-1 FHT cat one UCs q2-3 min IUPC placed

## 2017-04-28 NOTE — Progress Notes (Signed)
Delivery Note At 7:39 PM a viable female was delivered via  (Presentation: ;  ).  APGAR: 9, 9; weight  .   Placenta status: , .  Cord:  with the following complications: .  Cord pH: pending  Anesthesia:  1% lidocaine local Episiotomy: second degree MLE repaired Lacerations: none Suture Repair: 2.0 vicryl rapide Est. Blood Loss (mL):  300  Mom to postpartum.  Baby to Couplet care / Skin to Skin.  Kobe Jansma II,Kipper Buch E 04/28/2017, 7:58 PM

## 2017-04-29 LAB — CBC
HCT: 33.5 % — ABNORMAL LOW (ref 36.0–46.0)
Hemoglobin: 11.4 g/dL — ABNORMAL LOW (ref 12.0–15.0)
MCH: 30.2 pg (ref 26.0–34.0)
MCHC: 34 g/dL (ref 30.0–36.0)
MCV: 88.9 fL (ref 78.0–100.0)
Platelets: 127 10*3/uL — ABNORMAL LOW (ref 150–400)
RBC: 3.77 MIL/uL — ABNORMAL LOW (ref 3.87–5.11)
RDW: 13.5 % (ref 11.5–15.5)
WBC: 11.7 10*3/uL — ABNORMAL HIGH (ref 4.0–10.5)

## 2017-04-29 NOTE — Lactation Note (Signed)
This note was copied from a baby's chart. Lactation Consultation Note  Patient Name: Melanie Lawson [id number]'s Date: 04/29/2017 Reason for consult: Initial assessment Baby at 14 hr of life. Upon entry baby was sleeping in Dad's arms and mom was going to take a shower. Mom reports baby just fed. She denies breast or nipple pain, reports baby is latching easily, and voiced no concerns. She reports she can manually express, has seen colostrum, and has a spoon in the room. Discussed baby behavior, feeding frequency, baby belly size, voids, wt loss, breast changes, and nipple care. Given lactation handouts. Aware of OP services and support group. Mom will offer the breast on demand 8+/24hr and call lactation to observe a latch.     Maternal Data    Feeding Feeding Type: Breast Fed Length of feed: 10 min  LATCH Score/Interventions                      Lactation Tools Discussed/Used WIC Program: No   Consult Status Consult Status: Follow-up Date: 04/30/17 Follow-up type: In-patient    Melanie Lawson 04/29/2017, 10:32 AM

## 2017-04-29 NOTE — Progress Notes (Signed)
Post Partum Day 1 Subjective: no complaints, up ad lib, voiding, tolerating PO and + flatus  Objective: Blood pressure 110/69, pulse 65, temperature 98.1 F (36.7 C), temperature source Oral, resp. rate 14, height 5\' 8"  (1.727 m), weight 183 lb (83 kg), last menstrual period 07/16/2016, unknown if currently breastfeeding.  Physical Exam:  General: alert and cooperative Lochia: appropriate Uterine Fundus: firm Incision: healing well, labial edema noted DVT Evaluation: No evidence of DVT seen on physical exam. Negative Homan's sign. No cords or calf tenderness. No significant calf/ankle edema.   Recent Labs  04/28/17 0753 04/29/17 0523  HGB 12.8 11.4*  HCT 37.5 33.5*    Assessment/Plan: Plan for discharge tomorrow   LOS: 1 day   CURTIS,CAROL G 04/29/2017, 8:29 AM

## 2017-04-30 NOTE — Discharge Summary (Signed)
Obstetric Discharge Summary Reason for Admission: induction of labor Prenatal Procedures: none Intrapartum Procedures: spontaneous vaginal delivery Postpartum Procedures: none Complications-Operative and Postpartum: 2 degree perineal laceration Hemoglobin  Date Value Ref Range Status  04/29/2017 11.4 (L) 12.0 - 15.0 g/dL Final   HCT  Date Value Ref Range Status  04/29/2017 33.5 (L) 36.0 - 46.0 % Final    Physical Exam:  General: alert, cooperative, appears stated age and no distress Lochia: appropriate Uterine Fundus: firm Incision: healing well DVT Evaluation: No evidence of DVT seen on physical exam.  Discharge Diagnoses: Term Pregnancy-delivered  Discharge Information: Date: 04/30/2017 Activity: pelvic rest Diet: routine Medications: None Condition: stable Instructions: refer to practice specific booklet Discharge to: home   Newborn Data: Live born female  Birth Weight: 8 lb 14.5 oz (4040 g) APGAR: 9, 9  Home with mother.  Melanie Lawson C 04/30/2017, 9:14 AM

## 2017-04-30 NOTE — Lactation Note (Addendum)
This note was copied from a baby's chart. Lactation Consultation Note  Patient Name: Melanie Lawson ZOXWR'UToday's Date: 04/30/2017 Reason for consult: Follow-up assessment Mom reports baby is nursing well, some mild nipple tenderness with initial latch that improves, no breakdown observed. Mom applying EBM/coconut oil prn. Baby at 7.8% weight loss, now 37 hours old. Per chart review, baby has been to breast 11 times in past 24 hours plus 4 attempts, 4 voids and 8 stools in 24 hours, 11 stools in life. LS per RN have been 9. Advised Mom to continue to BF with feeding ques, 8-12 times or more in 24 hours. Try to keep baby nursing 15-30 minutes both breasts some feedings. Monitor voids/stools. Advised to call Peds if baby starts to be not satisfied at breast or becomes sleepy not waking for 2 or more feedings.  Engorgement care reviewed if needed. Advised of OP services and support group. Encouraged Mom to call for LC to observe feeding prior to d/c due to weight loss. LC left phone number for Mom to call.   Maternal Data    Feeding Feeding Type: Breast Fed Length of feed: 25 min  LATCH Score/Interventions Latch: Grasps breast easily, tongue down, lips flanged, rhythmical sucking.  Audible Swallowing: A few with stimulation Intervention(s): Skin to skin  Type of Nipple: Everted at rest and after stimulation  Comfort (Breast/Nipple): Soft / non-tender     Hold (Positioning): No assistance needed to correctly position infant at breast.  LATCH Score: 9  Lactation Tools Discussed/Used     Consult Status Consult Status: Follow-up Date: 04/30/17 Follow-up type: In-patient    Alfred LevinsGranger, Melanie Lawson 04/30/2017, 9:34 AM

## 2017-04-30 NOTE — Lactation Note (Signed)
This note was copied from a baby's chart. Lactation Consultation Note  Patient Name: Melanie Lawson XBJYN'WToday's Date: 04/30/2017 Reason for consult: Follow-up assessment Mom called for LC to observe latch. Baby sleepy at this visit, Mom latching baby independently. Good suckling bursts observed off/on, few noted swallows. Encouraged Mom to continue to BF with feeding ques, 8-12 times or more in 24 hours. Keep baby nursing for 16-30 minutes both breasts when possible. Engorgement care previously reviewed. Apply EBM to tender nipple/comfort gels. Mom to call for questions/concerns. Mom has DEBP at home, encouraged to consider post pumping for 15 minutes during the day and giving baby back any amount of EBM she receives.   Maternal Data    Feeding Feeding Type: Breast Fed Length of feed: 15 min  LATCH Score/Interventions Latch: Grasps breast easily, tongue down, lips flanged, rhythmical sucking.  Audible Swallowing: A few with stimulation  Type of Nipple: Everted at rest and after stimulation  Comfort (Breast/Nipple): Filling, red/small blisters or bruises, mild/mod discomfort  Problem noted: Mild/Moderate discomfort Interventions  (Cracked/bleeding/bruising/blister): Expressed breast milk to nipple Interventions (Mild/moderate discomfort): Comfort gels  Hold (Positioning): No assistance needed to correctly position infant at breast. Intervention(s): Breastfeeding basics reviewed;Support Pillows;Position options;Skin to skin  LATCH Score: 8  Lactation Tools Discussed/Used Tools: Comfort gels   Consult Status Consult Status: Complete Date: 04/30/17 Follow-up type: In-patient    Alfred Lawson, Melanie Alejos Ann 04/30/2017, 11:21 AM

## 2017-07-12 DIAGNOSIS — Z1389 Encounter for screening for other disorder: Secondary | ICD-10-CM | POA: Diagnosis not present

## 2017-07-12 DIAGNOSIS — Z0000 Encounter for general adult medical examination without abnormal findings: Secondary | ICD-10-CM | POA: Diagnosis not present

## 2017-07-22 DIAGNOSIS — Z6822 Body mass index (BMI) 22.0-22.9, adult: Secondary | ICD-10-CM | POA: Diagnosis not present

## 2017-07-22 DIAGNOSIS — R58 Hemorrhage, not elsewhere classified: Secondary | ICD-10-CM | POA: Diagnosis not present

## 2017-09-10 DIAGNOSIS — Z23 Encounter for immunization: Secondary | ICD-10-CM | POA: Diagnosis not present

## 2017-12-13 DIAGNOSIS — D2262 Melanocytic nevi of left upper limb, including shoulder: Secondary | ICD-10-CM | POA: Diagnosis not present

## 2017-12-13 DIAGNOSIS — D2261 Melanocytic nevi of right upper limb, including shoulder: Secondary | ICD-10-CM | POA: Diagnosis not present

## 2017-12-13 DIAGNOSIS — L858 Other specified epidermal thickening: Secondary | ICD-10-CM | POA: Diagnosis not present

## 2017-12-13 DIAGNOSIS — D225 Melanocytic nevi of trunk: Secondary | ICD-10-CM | POA: Diagnosis not present

## 2018-02-28 DIAGNOSIS — L309 Dermatitis, unspecified: Secondary | ICD-10-CM | POA: Diagnosis not present

## 2018-02-28 DIAGNOSIS — L720 Epidermal cyst: Secondary | ICD-10-CM | POA: Diagnosis not present

## 2018-06-22 DIAGNOSIS — Z6821 Body mass index (BMI) 21.0-21.9, adult: Secondary | ICD-10-CM | POA: Diagnosis not present

## 2018-06-22 DIAGNOSIS — Z01419 Encounter for gynecological examination (general) (routine) without abnormal findings: Secondary | ICD-10-CM | POA: Diagnosis not present

## 2018-07-28 DIAGNOSIS — Z0000 Encounter for general adult medical examination without abnormal findings: Secondary | ICD-10-CM | POA: Diagnosis not present

## 2018-08-04 DIAGNOSIS — Z6821 Body mass index (BMI) 21.0-21.9, adult: Secondary | ICD-10-CM | POA: Diagnosis not present

## 2018-08-04 DIAGNOSIS — Z1389 Encounter for screening for other disorder: Secondary | ICD-10-CM | POA: Diagnosis not present

## 2018-08-04 DIAGNOSIS — Z23 Encounter for immunization: Secondary | ICD-10-CM | POA: Diagnosis not present

## 2018-08-04 DIAGNOSIS — Z0000 Encounter for general adult medical examination without abnormal findings: Secondary | ICD-10-CM | POA: Diagnosis not present

## 2019-01-11 DIAGNOSIS — D2261 Melanocytic nevi of right upper limb, including shoulder: Secondary | ICD-10-CM | POA: Diagnosis not present

## 2019-01-11 DIAGNOSIS — D2262 Melanocytic nevi of left upper limb, including shoulder: Secondary | ICD-10-CM | POA: Diagnosis not present

## 2019-01-11 DIAGNOSIS — D220 Melanocytic nevi of lip: Secondary | ICD-10-CM | POA: Diagnosis not present

## 2019-01-11 DIAGNOSIS — D2239 Melanocytic nevi of other parts of face: Secondary | ICD-10-CM | POA: Diagnosis not present

## 2019-09-13 DIAGNOSIS — Z23 Encounter for immunization: Secondary | ICD-10-CM | POA: Diagnosis not present

## 2019-12-19 DIAGNOSIS — Z6822 Body mass index (BMI) 22.0-22.9, adult: Secondary | ICD-10-CM | POA: Diagnosis not present

## 2019-12-19 DIAGNOSIS — Z01419 Encounter for gynecological examination (general) (routine) without abnormal findings: Secondary | ICD-10-CM | POA: Diagnosis not present

## 2019-12-19 DIAGNOSIS — N926 Irregular menstruation, unspecified: Secondary | ICD-10-CM | POA: Diagnosis not present

## 2020-01-10 DIAGNOSIS — R42 Dizziness and giddiness: Secondary | ICD-10-CM | POA: Diagnosis not present

## 2020-01-10 DIAGNOSIS — M2669 Other specified disorders of temporomandibular joint: Secondary | ICD-10-CM | POA: Diagnosis not present

## 2020-01-10 DIAGNOSIS — G43C0 Periodic headache syndromes in child or adult, not intractable: Secondary | ICD-10-CM | POA: Diagnosis not present

## 2020-01-18 DIAGNOSIS — M2669 Other specified disorders of temporomandibular joint: Secondary | ICD-10-CM | POA: Diagnosis not present

## 2020-01-18 DIAGNOSIS — J343 Hypertrophy of nasal turbinates: Secondary | ICD-10-CM | POA: Diagnosis not present

## 2020-01-18 DIAGNOSIS — R42 Dizziness and giddiness: Secondary | ICD-10-CM | POA: Diagnosis not present

## 2020-03-10 DIAGNOSIS — N979 Female infertility, unspecified: Secondary | ICD-10-CM | POA: Diagnosis not present

## 2020-03-27 ENCOUNTER — Other Ambulatory Visit: Payer: Self-pay | Admitting: Obstetrics and Gynecology

## 2020-03-27 DIAGNOSIS — N979 Female infertility, unspecified: Secondary | ICD-10-CM

## 2020-04-03 ENCOUNTER — Ambulatory Visit
Admission: RE | Admit: 2020-04-03 | Discharge: 2020-04-03 | Disposition: A | Discharge status: Home / Self Care | Payer: BC Managed Care – PPO | Source: Ambulatory Visit | Attending: Obstetrics and Gynecology | Admitting: Obstetrics and Gynecology

## 2020-04-03 DIAGNOSIS — N978 Female infertility of other origin: Secondary | ICD-10-CM | POA: Diagnosis not present

## 2020-04-03 DIAGNOSIS — N979 Female infertility, unspecified: Secondary | ICD-10-CM

## 2020-06-17 DIAGNOSIS — N911 Secondary amenorrhea: Secondary | ICD-10-CM | POA: Diagnosis not present

## 2020-06-25 DIAGNOSIS — Z3685 Encounter for antenatal screening for Streptococcus B: Secondary | ICD-10-CM | POA: Diagnosis not present

## 2020-06-25 DIAGNOSIS — Z3481 Encounter for supervision of other normal pregnancy, first trimester: Secondary | ICD-10-CM | POA: Diagnosis not present

## 2020-06-25 LAB — OB RESULTS CONSOLE ABO/RH: RH Type: POSITIVE

## 2020-06-25 LAB — OB RESULTS CONSOLE RUBELLA ANTIBODY, IGM: Rubella: IMMUNE

## 2020-06-25 LAB — OB RESULTS CONSOLE HIV ANTIBODY (ROUTINE TESTING): HIV: NONREACTIVE

## 2020-06-25 LAB — OB RESULTS CONSOLE HEPATITIS B SURFACE ANTIGEN: Hepatitis B Surface Ag: NEGATIVE

## 2020-06-25 LAB — OB RESULTS CONSOLE ANTIBODY SCREEN: Antibody Screen: NEGATIVE

## 2020-06-25 LAB — OB RESULTS CONSOLE RPR: RPR: NONREACTIVE

## 2020-07-08 DIAGNOSIS — Z3481 Encounter for supervision of other normal pregnancy, first trimester: Secondary | ICD-10-CM | POA: Diagnosis not present

## 2020-07-08 DIAGNOSIS — Z113 Encounter for screening for infections with a predominantly sexual mode of transmission: Secondary | ICD-10-CM | POA: Diagnosis not present

## 2020-07-09 LAB — OB RESULTS CONSOLE GC/CHLAMYDIA
Chlamydia: NEGATIVE
Gonorrhea: NEGATIVE

## 2020-09-03 DIAGNOSIS — Z3A18 18 weeks gestation of pregnancy: Secondary | ICD-10-CM | POA: Diagnosis not present

## 2020-09-03 DIAGNOSIS — Z363 Encounter for antenatal screening for malformations: Secondary | ICD-10-CM | POA: Diagnosis not present

## 2020-09-06 DIAGNOSIS — Z23 Encounter for immunization: Secondary | ICD-10-CM | POA: Diagnosis not present

## 2020-10-29 DIAGNOSIS — Z36 Encounter for antenatal screening for chromosomal anomalies: Secondary | ICD-10-CM | POA: Diagnosis not present

## 2020-10-29 DIAGNOSIS — Z3480 Encounter for supervision of other normal pregnancy, unspecified trimester: Secondary | ICD-10-CM | POA: Diagnosis not present

## 2020-10-29 DIAGNOSIS — Z23 Encounter for immunization: Secondary | ICD-10-CM | POA: Diagnosis not present

## 2021-01-06 DIAGNOSIS — Z3685 Encounter for antenatal screening for Streptococcus B: Secondary | ICD-10-CM | POA: Diagnosis not present

## 2021-01-06 LAB — OB RESULTS CONSOLE GBS: GBS: NEGATIVE

## 2021-01-22 ENCOUNTER — Encounter (HOSPITAL_COMMUNITY): Payer: Self-pay | Admitting: *Deleted

## 2021-01-22 ENCOUNTER — Telehealth (HOSPITAL_COMMUNITY): Payer: Self-pay | Admitting: *Deleted

## 2021-01-22 NOTE — Telephone Encounter (Signed)
Preadmission screen  

## 2021-01-26 ENCOUNTER — Encounter (HOSPITAL_COMMUNITY): Payer: Self-pay | Admitting: Obstetrics & Gynecology

## 2021-01-26 ENCOUNTER — Inpatient Hospital Stay (EMERGENCY_DEPARTMENT_HOSPITAL)
Admission: AD | Admit: 2021-01-26 | Discharge: 2021-01-26 | Disposition: A | Discharge status: Home / Self Care | Payer: BC Managed Care – PPO | Source: Home / Self Care | Attending: Obstetrics & Gynecology | Admitting: Obstetrics & Gynecology

## 2021-01-26 ENCOUNTER — Other Ambulatory Visit (HOSPITAL_COMMUNITY)
Admission: RE | Admit: 2021-01-26 | Discharge: 2021-01-26 | Disposition: A | Discharge status: Home / Self Care | Payer: BC Managed Care – PPO | Source: Ambulatory Visit | Attending: Obstetrics and Gynecology | Admitting: Obstetrics and Gynecology

## 2021-01-26 DIAGNOSIS — Z3A39 39 weeks gestation of pregnancy: Secondary | ICD-10-CM

## 2021-01-26 DIAGNOSIS — Z412 Encounter for routine and ritual male circumcision: Secondary | ICD-10-CM | POA: Diagnosis not present

## 2021-01-26 DIAGNOSIS — O471 False labor at or after 37 completed weeks of gestation: Secondary | ICD-10-CM | POA: Insufficient documentation

## 2021-01-26 DIAGNOSIS — Z20822 Contact with and (suspected) exposure to covid-19: Secondary | ICD-10-CM | POA: Diagnosis not present

## 2021-01-26 DIAGNOSIS — Z0371 Encounter for suspected problem with amniotic cavity and membrane ruled out: Secondary | ICD-10-CM | POA: Diagnosis not present

## 2021-01-26 DIAGNOSIS — O26893 Other specified pregnancy related conditions, third trimester: Secondary | ICD-10-CM | POA: Diagnosis not present

## 2021-01-26 DIAGNOSIS — Z01812 Encounter for preprocedural laboratory examination: Secondary | ICD-10-CM | POA: Insufficient documentation

## 2021-01-26 DIAGNOSIS — Z23 Encounter for immunization: Secondary | ICD-10-CM | POA: Diagnosis not present

## 2021-01-26 DIAGNOSIS — O411230 Chorioamnionitis, third trimester, not applicable or unspecified: Secondary | ICD-10-CM | POA: Diagnosis not present

## 2021-01-26 DIAGNOSIS — Z3A00 Weeks of gestation of pregnancy not specified: Secondary | ICD-10-CM | POA: Diagnosis not present

## 2021-01-26 LAB — SARS CORONAVIRUS 2 (TAT 6-24 HRS): SARS Coronavirus 2: NEGATIVE

## 2021-01-26 LAB — POCT FERN TEST: POCT Fern Test: NEGATIVE

## 2021-01-26 NOTE — H&P (Signed)
Melanie Lawson is a 30 y.o. female presenting for IOL at term. Pregnancy uncomplicated. OB History    Gravida  2   Para  1   Term  1   Preterm      AB      Living  1     SAB      IAB      Ectopic      Multiple  0   Live Births  1          Past Medical History:  Diagnosis Date  . Medical history non-contributory   . PONV (postoperative nausea and vomiting)    Past Surgical History:  Procedure Laterality Date  . LAPAROSCOPIC APPENDECTOMY N/A 04/10/2013   Procedure: APPENDECTOMY LAPAROSCOPIC;  Surgeon: Rolm Bookbinder, MD;  Location: [facility] OR;  Service: General;  Laterality: N/A;  latex allergies   Family History: family history includes Melanoma in her paternal uncle; Multiple myeloma in her father; Stroke in her maternal grandfather and maternal grandmother. Social History:  reports that she has never smoked. She has never used smokeless tobacco. She reports current alcohol use. She reports that she does not use drugs.     Maternal Diabetes: No Genetic Screening: Normal Maternal Ultrasounds/Referrals: Normal Fetal Ultrasounds or other Referrals:  None Maternal Substance Abuse:  No Significant Maternal Medications:  None Significant Maternal Lab Results:  Group B Strep negative Other Comments:  None  Review of Systems  Constitutional: Negative for fever.  Eyes: Negative for visual disturbance.  Gastrointestinal: Negative for abdominal pain.  Neurological: Negative for headaches.   Maternal Medical History:  Fetal activity: Perceived fetal activity is normal.        unknown if currently breastfeeding. Maternal Exam:  Abdomen: Fetal presentation: vertex     Physical Exam Cardiovascular:     Rate and Rhythm: Normal rate.  Pulmonary:     Effort: Pulmonary effort is normal.     Cs 2/50/-2  Prenatal labs: ABO, Rh: B/Positive/-- (07/28 0000) Antibody: Negative (07/28 0000) Rubella: Immune (07/28 0000) RPR: Nonreactive (07/28 0000)  HBsAg:  Negative (07/28 0000)  HIV: Non-reactive (07/28 0000)  GBS:   Negative 01/06/21  Assessment/Plan: 30 yo G2P1 @ 39 4/7 wks for IOL   Melanie Lawson 01/26/2021, 2:39 PM

## 2021-01-26 NOTE — Discharge Instructions (Signed)
Fetal Movement Counts Patient Name: ________________________________________________ Patient Due Date: ____________________  What is a fetal movement count? A fetal movement count is the number of times that you feel your baby move during a certain amount of time. This may also be called a fetal kick count. A fetal movement count is recommended for every pregnant woman. You may be asked to start counting fetal movements as early as week 28 of your pregnancy. Pay attention to when your baby is most active. You may notice your baby's sleep and wake cycles. You may also notice things that make your baby move more. You should do a fetal movement count:  When your baby is normally most active.  At the same time each day. A good time to count movements is while you are resting, after having something to eat and drink. How do I count fetal movements? 1. Find a quiet, comfortable area. Sit, or lie down on your side. 2. Write down the date, the start time and stop time, and the number of movements that you felt between those two times. Take this information with you to your health care visits. 3. Write down your start time when you feel the first movement. 4. Count kicks, flutters, swishes, rolls, and jabs. You should feel at least 10 movements. 5. You may stop counting after you have felt 10 movements, or if you have been counting for 2 hours. Write down the stop time. 6. If you do not feel 10 movements in 2 hours, contact your health care provider for further instructions. Your health care provider may want to do additional tests to assess your baby's well-being. Contact a health care provider if:  You feel fewer than 10 movements in 2 hours.  Your baby is not moving like he or she usually does. Date: ____________ Start time: ____________ Stop time: ____________ Movements: ____________ Date: ____________ Start time: ____________ Stop time: ____________ Movements: ____________ Date: ____________  Start time: ____________ Stop time: ____________ Movements: ____________ Date: ____________ Start time: ____________ Stop time: ____________ Movements: ____________ Date: ____________ Start time: ____________ Stop time: ____________ Movements: ____________ Date: ____________ Start time: ____________ Stop time: ____________ Movements: ____________ Date: ____________ Start time: ____________ Stop time: ____________ Movements: ____________ Date: ____________ Start time: ____________ Stop time: ____________ Movements: ____________ Date: ____________ Start time: ____________ Stop time: ____________ Movements: ____________ This information is not intended to replace advice given to you by your health care provider. Make sure you discuss any questions you have with your health care provider. Document Revised: 07/05/2019 Document Reviewed: 07/05/2019 Elsevier Patient Education  2021 [employer] Signs and Symptoms of Labor Labor is the body's natural process of moving the baby and the placenta out of the uterus. The process of labor usually starts when the baby is full-term, between 37 and 40 weeks of pregnancy. Signs and symptoms that you are close to going into labor As your body prepares for labor and the birth of your baby, you may notice the following symptoms in the weeks and days before true labor starts:  Passing a small amount of thick, bloody mucus from your vagina. This is called normal bloody show or losing your mucus plug. This may happen more than a week before labor begins, or right before labor begins, as the opening of the cervix starts to widen (dilate). For some women, the entire mucus plug passes at once. For others, pieces of the mucus plug may gradually pass over several days.  Your baby moving (dropping) lower in your pelvis   to get into position for birth (lightening). When this happens, you may feel more pressure on your bladder and pelvic bone and less pressure on your ribs. This  may make it easier to breathe. It may also cause you to need to urinate more often and have problems with bowel movements.  Having "practice contractions," also called Braxton Hicks contractions or false labor. These occur at irregular (unevenly spaced) intervals that are more than 10 minutes apart. False labor contractions are common after exercise or sexual activity. They will stop if you change position, rest, or drink fluids. These contractions are usually mild and do not get stronger over time. They may feel like: ? A backache or back pain. ? Mild cramps, similar to menstrual cramps. ? Tightening or pressure in your abdomen. Other early symptoms include:  Nausea or loss of appetite.  Diarrhea.  Having a sudden burst of energy, or feeling very tired.  Mood changes.  Having trouble sleeping.   Signs and symptoms that labor has begun Signs that you are in labor may include:  Having contractions that come at regular (evenly spaced) intervals and increase in intensity. This may feel like more intense tightening or pressure in your abdomen that moves to your back. ? Contractions may also feel like rhythmic pain in your upper thighs or back that comes and goes at regular intervals. ? For first-time mothers, this change in intensity of contractions often occurs at a more gradual pace. ? Women who have given birth before may notice a more rapid progression of contraction changes.  Feeling pressure in the vaginal area.  Your water breaking (rupture of membranes). This is when the sac of fluid that surrounds your baby breaks. Fluid leaking from your vagina may be clear or blood-tinged. Labor usually starts within 24 hours of your water breaking, but it may take longer to begin. ? Some women may feel a sudden gush of fluid. ? Others notice that their underwear repeatedly becomes damp. Follow these instructions at home:  When labor starts, or if your water breaks, call your health care  provider or nurse care line. Based on your situation, they will determine when you should go in for an exam.  During early labor, you may be able to rest and manage symptoms at home. Some strategies to try at home include: ? Breathing and relaxation techniques. ? Taking a warm bath or shower. ? Listening to music. ? Using a heating pad on the lower back for pain. If you are directed to use heat:  Place a towel between your skin and the heat source.  Leave the heat on for 20-30 minutes.  Remove the heat if your skin turns bright red. This is especially important if you are unable to feel pain, heat, or cold. You may have a greater risk of getting burned.   Contact a health care provider if:  Your labor has started.  Your water breaks. Get help right away if:  You have painful, regular contractions that are 5 minutes apart or less.  Labor starts before you are [redacted] weeks along in your pregnancy.  You have a fever.  You have bright red blood coming from your vagina.  You do not feel your baby moving.  You have a severe headache with or without vision problems.  You have severe nausea, vomiting, or diarrhea.  You have chest pain or shortness of breath. These symptoms may represent a serious problem that is an emergency. Do not wait to see   if the symptoms will go away. Get medical help right away. Call your local emergency services (911 in the [place] Do not drive yourself to the hospital. Summary  Labor is your body's natural process of moving your baby and the placenta out of your uterus.  The process of labor usually starts when your baby is full-term, between 37 and 40 weeks of pregnancy.  When labor starts, or if your water breaks, call your health care provider or nurse care line. Based on your situation, they will determine when you should go in for an exam. This information is not intended to replace advice given to you by your health care provider. Make sure you discuss  any questions you have with your health care provider. Document Revised: 09/06/2020 Document Reviewed: 09/06/2020 Elsevier Patient Education  2021 Elsevier Inc.  

## 2021-01-26 NOTE — MAU Provider Note (Signed)
Event Date/Time   First Provider Initiated Contact with Patient 01/26/21 1726       S: [name] is a 30 y.o. G2P1001 at [redacted]w[redacted]d  who presents to MAU today complaining of leaking of fluid since this morning at 1030 am. She denies vaginal bleeding. She denies contractions. She reports normal fetal movement.    O: BP 114/78   Pulse 86   Temp 98.7 F (37.1 C)   Resp 18   Ht 5\' 7"  (1.702 m)   Wt 79.4 kg   BMI 27.41 kg/m  GENERAL: Well-developed, well-nourished female in no acute distress.  HEAD: Normocephalic, atraumatic.  CHEST: Normal effort of breathing, regular heart rate ABDOMEN: Soft, nontender, gravid PELVIC: Normal external female genitalia. Vagina is pink and rugated. Cervix with normal contour, no lesions. Normal discharge.  No pooling.   Cervical exam: not indicated     Fetal Monitoring: Baseline: 145 Variability: moderate Accelerations: 15x15 Decelerations: none Contractions: irregular  Results for orders placed or performed during the hospital encounter of 01/26/21 (from the past 24 hour(s))  POCT fern test     Status: Normal   Collection Time: 01/26/21  5:50 PM  Result Value Ref Range   POCT Fern Test Negative = intact amniotic membranes      A: [facility] at [redacted]w[redacted]d  Membranes intact  P: Discharge home  Return tomorrow for schedule induction or sooner as needed  [redacted]w[redacted]d, NP 01/26/2021 5:51 PM

## 2021-01-26 NOTE — MAU Note (Signed)
Pt stated she felt some fluid come out around 10;30 this morning. Feels like some wetness since but not a lot. Reports increased pelvic pressure and tightening but not regular ctx. Feta movement a little less than usual.

## 2021-01-27 ENCOUNTER — Inpatient Hospital Stay (HOSPITAL_COMMUNITY): Payer: BC Managed Care – PPO

## 2021-01-27 ENCOUNTER — Inpatient Hospital Stay (HOSPITAL_COMMUNITY)
Admission: AD | Admit: 2021-01-27 | Discharge: 2021-01-29 | DRG: 807 | Disposition: A | Discharge status: Home / Self Care | Payer: BC Managed Care – PPO | Attending: Obstetrics and Gynecology | Admitting: Obstetrics and Gynecology

## 2021-01-27 ENCOUNTER — Encounter (HOSPITAL_COMMUNITY): Payer: Self-pay | Admitting: Obstetrics and Gynecology

## 2021-01-27 ENCOUNTER — Inpatient Hospital Stay (HOSPITAL_COMMUNITY): Payer: BC Managed Care – PPO | Admitting: Anesthesiology

## 2021-01-27 ENCOUNTER — Other Ambulatory Visit: Payer: Self-pay

## 2021-01-27 DIAGNOSIS — O26893 Other specified pregnancy related conditions, third trimester: Secondary | ICD-10-CM | POA: Diagnosis present

## 2021-01-27 DIAGNOSIS — Z3490 Encounter for supervision of normal pregnancy, unspecified, unspecified trimester: Secondary | ICD-10-CM

## 2021-01-27 DIAGNOSIS — Z20822 Contact with and (suspected) exposure to covid-19: Secondary | ICD-10-CM | POA: Diagnosis present

## 2021-01-27 DIAGNOSIS — Z3A39 39 weeks gestation of pregnancy: Secondary | ICD-10-CM

## 2021-01-27 LAB — CBC
HCT: 39.6 % (ref 36.0–46.0)
Hemoglobin: 13.8 g/dL (ref 12.0–15.0)
MCH: 32.2 pg (ref 26.0–34.0)
MCHC: 34.8 g/dL (ref 30.0–36.0)
MCV: 92.5 fL (ref 80.0–100.0)
Platelets: 164 10*3/uL (ref 150–400)
RBC: 4.28 MIL/uL (ref 3.87–5.11)
RDW: 12.4 % (ref 11.5–15.5)
WBC: 7.3 10*3/uL (ref 4.0–10.5)
nRBC: 0 % (ref 0.0–0.2)

## 2021-01-27 LAB — TYPE AND SCREEN
ABO/RH(D): B POS
Antibody Screen: NEGATIVE

## 2021-01-27 MED ORDER — DIPHENHYDRAMINE HCL 50 MG/ML IJ SOLN: 12.5000 mg | INTRAMUSCULAR | Status: DC | PRN

## 2021-01-27 MED ORDER — SIMETHICONE 80 MG PO CHEW: 80.0000 mg | CHEWABLE_TABLET | ORAL | Status: DC | PRN

## 2021-01-27 MED ORDER — BENZOCAINE-MENTHOL 20-0.5 % EX AERO
1.0000 "application " | INHALATION_SPRAY | CUTANEOUS | Status: DC | PRN
  Administered 2021-01-28: 1 via TOPICAL
  Filled 2021-01-27: qty 56

## 2021-01-27 MED ORDER — OXYTOCIN-SODIUM CHLORIDE 30-0.9 UT/500ML-% IV SOLN
2.5000 [IU]/h | INTRAVENOUS | Status: DC
  Administered 2021-01-27: 2.5 [IU]/h via INTRAVENOUS

## 2021-01-27 MED ORDER — SOD CITRATE-CITRIC ACID 500-334 MG/5ML PO SOLN: 30.0000 mL | ORAL | Status: DC | PRN

## 2021-01-27 MED ORDER — COCONUT OIL OIL
1.0000 "application " | TOPICAL_OIL | Status: DC | PRN
  Administered 2021-01-28: 1 via TOPICAL

## 2021-01-27 MED ORDER — ONDANSETRON HCL 4 MG/2ML IJ SOLN: 4.0000 mg | Freq: Four times a day (QID) | INTRAMUSCULAR | Status: DC | PRN

## 2021-01-27 MED ORDER — ONDANSETRON HCL 4 MG/2ML IJ SOLN: 4.0000 mg | INTRAMUSCULAR | Status: DC | PRN

## 2021-01-27 MED ORDER — PHENYLEPHRINE 40 MCG/ML (10ML) SYRINGE FOR IV PUSH (FOR BLOOD PRESSURE SUPPORT)
80.0000 ug | PREFILLED_SYRINGE | INTRAVENOUS | Status: AC | PRN
  Administered 2021-01-27 (×3): 80 ug via INTRAVENOUS

## 2021-01-27 MED ORDER — LACTATED RINGERS IV SOLN: 500.0000 mL | Freq: Once | INTRAVENOUS | Status: DC

## 2021-01-27 MED ORDER — LIDOCAINE HCL (PF) 1 % IJ SOLN
INTRAMUSCULAR | Status: DC | PRN
  Administered 2021-01-27 (×2): 4 mL via EPIDURAL

## 2021-01-27 MED ORDER — LACTATED RINGERS IV SOLN: INTRAVENOUS | Status: DC

## 2021-01-27 MED ORDER — FENTANYL CITRATE (PF) 100 MCG/2ML IJ SOLN: 50.0000 ug | INTRAMUSCULAR | Status: DC | PRN

## 2021-01-27 MED ORDER — OXYCODONE-ACETAMINOPHEN 5-325 MG PO TABS: 1.0000 | ORAL_TABLET | ORAL | Status: DC | PRN

## 2021-01-27 MED ORDER — DIBUCAINE (PERIANAL) 1 % EX OINT: 1.0000 "application " | TOPICAL_OINTMENT | CUTANEOUS | Status: DC | PRN

## 2021-01-27 MED ORDER — ACETAMINOPHEN 325 MG PO TABS: 650.0000 mg | ORAL_TABLET | ORAL | Status: DC | PRN

## 2021-01-27 MED ORDER — PHENYLEPHRINE 40 MCG/ML (10ML) SYRINGE FOR IV PUSH (FOR BLOOD PRESSURE SUPPORT)
80.0000 ug | PREFILLED_SYRINGE | INTRAVENOUS | Status: DC | PRN
  Administered 2021-01-27: 80 ug via INTRAVENOUS
  Filled 2021-01-27: qty 10

## 2021-01-27 MED ORDER — DIPHENHYDRAMINE HCL 25 MG PO CAPS: 25.0000 mg | ORAL_CAPSULE | Freq: Four times a day (QID) | ORAL | Status: DC | PRN

## 2021-01-27 MED ORDER — ZOLPIDEM TARTRATE 5 MG PO TABS: 5.0000 mg | ORAL_TABLET | Freq: Every evening | ORAL | Status: DC | PRN

## 2021-01-27 MED ORDER — FLEET ENEMA 7-19 GM/118ML RE ENEM: 1.0000 | ENEMA | RECTAL | Status: DC | PRN

## 2021-01-27 MED ORDER — TETANUS-DIPHTH-ACELL PERTUSSIS 5-2.5-18.5 LF-MCG/0.5 IM SUSY: 0.5000 mL | PREFILLED_SYRINGE | Freq: Once | INTRAMUSCULAR | Status: DC

## 2021-01-27 MED ORDER — OXYTOCIN-SODIUM CHLORIDE 30-0.9 UT/500ML-% IV SOLN
1.0000 m[IU]/min | INTRAVENOUS | Status: DC
  Administered 2021-01-27: 2 m[IU]/min via INTRAVENOUS
  Filled 2021-01-27: qty 500

## 2021-01-27 MED ORDER — METHYLERGONOVINE MALEATE 0.2 MG/ML IJ SOLN
0.2000 mg | Freq: Once | INTRAMUSCULAR | Status: AC
  Administered 2021-01-27: 0.2 mg via INTRAMUSCULAR

## 2021-01-27 MED ORDER — TERBUTALINE SULFATE 1 MG/ML IJ SOLN: 0.2500 mg | Freq: Once | INTRAMUSCULAR | Status: DC | PRN

## 2021-01-27 MED ORDER — IBUPROFEN 600 MG PO TABS
600.0000 mg | ORAL_TABLET | Freq: Four times a day (QID) | ORAL | Status: DC
  Administered 2021-01-28 – 2021-01-29 (×6): 600 mg via ORAL
  Filled 2021-01-27 (×5): qty 1

## 2021-01-27 MED ORDER — LACTATED RINGERS IV SOLN: 500.0000 mL | INTRAVENOUS | Status: DC | PRN

## 2021-01-27 MED ORDER — METHYLERGONOVINE MALEATE 0.2 MG/ML IJ SOLN
INTRAMUSCULAR | Status: AC
  Filled 2021-01-27: qty 1

## 2021-01-27 MED ORDER — OXYCODONE-ACETAMINOPHEN 5-325 MG PO TABS: 2.0000 | ORAL_TABLET | ORAL | Status: DC | PRN

## 2021-01-27 MED ORDER — WITCH HAZEL-GLYCERIN EX PADS
1.0000 | MEDICATED_PAD | CUTANEOUS | Status: DC | PRN
Start: 2021-01-27 — End: 2021-01-29

## 2021-01-27 MED ORDER — OXYTOCIN BOLUS FROM INFUSION
333.0000 mL | Freq: Once | INTRAVENOUS | Status: AC
  Administered 2021-01-27: 333 mL via INTRAVENOUS

## 2021-01-27 MED ORDER — OXYCODONE HCL 5 MG PO TABS: 5.0000 mg | ORAL_TABLET | ORAL | Status: DC | PRN

## 2021-01-27 MED ORDER — EPHEDRINE 5 MG/ML INJ
10.0000 mg | INTRAVENOUS | Status: DC | PRN
  Administered 2021-01-27: 10 mg via INTRAVENOUS

## 2021-01-27 MED ORDER — OXYCODONE HCL 5 MG PO TABS: 10.0000 mg | ORAL_TABLET | ORAL | Status: DC | PRN

## 2021-01-27 MED ORDER — EPHEDRINE 5 MG/ML INJ
10.0000 mg | INTRAVENOUS | Status: DC | PRN
  Filled 2021-01-27: qty 10

## 2021-01-27 MED ORDER — SENNOSIDES-DOCUSATE SODIUM 8.6-50 MG PO TABS
2.0000 | ORAL_TABLET | ORAL | Status: DC
  Administered 2021-01-28: 2 via ORAL
  Filled 2021-01-27: qty 2

## 2021-01-27 MED ORDER — PRENATAL MULTIVITAMIN CH
1.0000 | ORAL_TABLET | Freq: Every day | ORAL | Status: DC
  Administered 2021-01-28: 1 via ORAL
  Filled 2021-01-27: qty 1

## 2021-01-27 MED ORDER — LIDOCAINE HCL (PF) 1 % IJ SOLN: 30.0000 mL | INTRAMUSCULAR | Status: DC | PRN

## 2021-01-27 MED ORDER — ONDANSETRON HCL 4 MG PO TABS: 4.0000 mg | ORAL_TABLET | ORAL | Status: DC | PRN

## 2021-01-27 MED ORDER — FENTANYL-BUPIVACAINE-NACL 0.5-0.125-0.9 MG/250ML-% EP SOLN
12.0000 mL/h | EPIDURAL | Status: DC | PRN
  Administered 2021-01-27: 12 mL/h via EPIDURAL
  Filled 2021-01-27: qty 250

## 2021-01-27 NOTE — Progress Notes (Signed)
FHT cat one Cx 3-4/80/-2 UCs q2 min IUPC placed

## 2021-01-27 NOTE — Progress Notes (Signed)
No changes to H&P FHT cat one UCs mild Pitocin infusing Cx 2-3/60/-2/vtx/very soft/posterior Attempted AROM>unsuccessful with gentle attempts Will continue pitocin and AROM later

## 2021-01-27 NOTE — Anesthesia Preprocedure Evaluation (Signed)
Anesthesia Evaluation  Patient identified by MRN, date of birth, ID band Patient awake    Reviewed: Allergy & Precautions, H&P , Patient's Chart, lab work & pertinent test results  History of Anesthesia Complications (+) PONV and history of anesthetic complications  Airway Mallampati: I  TM Distance: >3 FB Neck ROM: Full    Dental  (+) Teeth Intact, Dental Advisory Given   Pulmonary neg pulmonary ROS,    breath sounds clear to auscultation       Cardiovascular negative cardio ROS   Rhythm:Regular Rate:Normal     Neuro/Psych negative neurological ROS  negative psych ROS   GI/Hepatic negative GI ROS, Neg liver ROS,   Endo/Other  negative endocrine ROS  Renal/GU negative Renal ROS  negative genitourinary   Musculoskeletal negative musculoskeletal ROS (+)   Abdominal   Peds  Hematology negative hematology ROS (+)   Anesthesia Other Findings   Reproductive/Obstetrics (+) Pregnancy                             Anesthesia Physical  Anesthesia Plan  ASA: II  Anesthesia Plan: Epidural   Post-op Pain Management:    Induction: Intravenous  PONV Risk Score and Plan:   Airway Management Planned: Natural Airway  Additional Equipment:   Intra-op Plan:   Post-operative Plan:   Informed Consent: I have reviewed the patients History and Physical, chart, labs and discussed the procedure including the risks, benefits and alternatives for the proposed anesthesia with the patient or authorized representative who has indicated his/her understanding and acceptance.     Dental advisory given  Plan Discussed with: Anesthesiologist  Anesthesia Plan Comments:         Anesthesia Quick Evaluation

## 2021-01-27 NOTE — Progress Notes (Signed)
3/70/-2 AROM clear FHT cat one UCs q2-3 min

## 2021-01-27 NOTE — Anesthesia Procedure Notes (Signed)
Epidural Patient location during procedure: OB Start time: 01/27/2021 4:38 PM End time: 01/27/2021 4:45 PM  Staffing Anesthesiologist: Mal Amabile, MD  Preanesthetic Checklist Completed: patient identified, IV checked, site marked, risks and benefits discussed, surgical consent, monitors and equipment checked, pre-op evaluation and timeout performed  Epidural Patient position: sitting Prep: DuraPrep and site prepped and draped Patient monitoring: continuous pulse ox and blood pressure Approach: midline Location: L3-L4 Injection technique: LOR air  Needle:  Needle type: Tuohy  Needle gauge: 17 G Needle length: 9 cm and 9 Needle insertion depth: 4 cm Catheter type: closed end flexible Catheter size: 19 Gauge Catheter at skin depth: 9 cm Test dose: negative and Other  Assessment Events: blood not aspirated, injection not painful, no injection resistance, no paresthesia and negative IV test  Additional Notes Patient identified. Risks and benefits discussed including failed block, incomplete  Pain control, post dural puncture headache, nerve damage, paralysis, blood pressure Changes, nausea, vomiting, reactions to medications-both toxic and allergic and post Partum back pain. All questions were answered. Patient expressed understanding and wished to proceed. Sterile technique was used throughout procedure. Epidural site was Dressed with sterile barrier dressing. No paresthesias, signs of intravascular injection Or signs of intrathecal spread were encountered.  Patient was more comfortable after the epidural was dosed. Please see RN's note for documentation of vital signs and FHR which are stable. Reason for block:procedure for pain

## 2021-01-27 NOTE — Progress Notes (Signed)
FHT cat one Cx 5/C/-2/mid position

## 2021-01-27 NOTE — Progress Notes (Signed)
Operative Delivery Note At 9:32 PM a viable female was delivered via .  Presentation: vertex; Position: Right,, Occiput,, Posterior; Station: +3.  Verbal consent: obtained from patient.  Risks and benefits discussed in detail.  Risks include, but are not limited to the risks of anesthesia, bleeding, infection, damage to maternal tissues, fetal cephalhematoma.  There is also the risk of inability to effect vaginal delivery of the head, or shoulder dystocia that cannot be resolved by established maneuvers, leading to the need for emergency cesarean section. Called to room due to patient's low BP. Cx was noted to be 10 cm. She also had decreased BP to 70-80s sys. Nurse treated with IV fluids. Upon my arrival into room Dr Orlin Hilding of anesthesiology is there. Dr Lafayette Dragon notes patient has been treated with pheynylephrine x 2 and ephedrine x 1. IV fluid bolus of 1 Liter is completing. Dr Drue Stager is concerned about patient's BP. Patient denies SOB, denies CP. Patient has been reporting she feels light headed and feels "funny". After exam I recommend VE to expedite delivery. FHT remain Cat one. Foley catheter removed. Mity Vac VE applied. Patient pushes with good effort and notes she feels better with pushing. Over 3 UCs good progress. Vtx rotates to ROA on perineum. No popoffs. Baby delivers without difficulty. After one minute cord clamped and cut. Peds present and examine baby. APGAR:8 ,9 ; weight  .   Placenta status:intact ,to path .   Cord: 3 vessels with the following complications: none. .  Cord pH: art pH pending  Anesthesia:   Instruments: Mity Vac Episiotomy: None Lacerations: Periurethral, right, second degree and bleeding>repaired Suture Repair: 2.0 vicryl Est. Blood Loss (mL):  150 She had some brisk bleeding right after delivery. Methergine 0.2mg  IM x 1 Mom to postpartum.  Baby to Couplet care / Skin to Skin. After delivery BP 134/69. Patient stable in LDR. Roselle Locus II 01/27/2021, 9:45  PM

## 2021-01-28 ENCOUNTER — Encounter (HOSPITAL_COMMUNITY): Payer: Self-pay | Admitting: Obstetrics and Gynecology

## 2021-01-28 LAB — CBC
HCT: 37.5 % (ref 36.0–46.0)
Hemoglobin: 13.3 g/dL (ref 12.0–15.0)
MCH: 33.1 pg (ref 26.0–34.0)
MCHC: 35.5 g/dL (ref 30.0–36.0)
MCV: 93.3 fL (ref 80.0–100.0)
Platelets: 142 10*3/uL — ABNORMAL LOW (ref 150–400)
RBC: 4.02 MIL/uL (ref 3.87–5.11)
RDW: 12.3 % (ref 11.5–15.5)
WBC: 12.3 10*3/uL — ABNORMAL HIGH (ref 4.0–10.5)
nRBC: 0 % (ref 0.0–0.2)

## 2021-01-28 LAB — RPR: RPR Ser Ql: NONREACTIVE

## 2021-01-28 NOTE — Anesthesia Postprocedure Evaluation (Signed)
Anesthesia Post Note  Patient: Melanie Lawson  Procedure(s) Performed: AN AD HOC LABOR EPIDURAL     Patient location during evaluation: Mother Baby Anesthesia Type: Epidural Level of consciousness: awake Pain management: satisfactory to patient Vital Signs Assessment: post-procedure vital signs reviewed and stable Respiratory status: spontaneous breathing Cardiovascular status: stable Anesthetic complications: no   No complications documented.  Last Vitals:  Vitals:   01/28/21 0944 01/28/21 1430  BP: 97/68 103/63  Pulse: 64 63  Resp: 16 18  Temp: 36.6 C 36.7 C  SpO2: 98% 99%    Last Pain:  Vitals:   01/28/21 1654  TempSrc:   PainSc: Asleep   Pain Goal:                   [employer]

## 2021-01-28 NOTE — Progress Notes (Signed)
Postpartum Progress Note  Post Partum Day 1 s/p vacuum asssisted vaginal delivery.  Patient reports well-controlled pain, ambulating without difficulty, voiding spontaneously, tolerating PO.  Vaginal bleeding is appropriate.  Denies lightheadedness, dizziness. Ambulating well.    Objective: Blood pressure 94/63, pulse 65, temperature 98 F (36.7 C), temperature source Oral, resp. rate 16, height 5\' 7"  (1.702 m), weight 80 kg, SpO2 99 %, unknown if currently breastfeeding.  Physical Exam:  General: alert and no distress Lochia: appropriate Uterine Fundus: firm DVT Evaluation: No evidence of DVT seen on physical exam.  Recent Labs    01/27/21 1046 01/28/21 0506  HGB 13.8 13.3  HCT 39.6 37.5    Assessment/Plan: . Postpartum Day 1, s/p vaginal delivery. . Continue routine postpartum care . Lactation following . Desires circ, awaiting baby void.  Will perform with cleared by nursery . Anticipate discharge home tomorrow   LOS: 1 day   03/30/21 01/28/2021, 7:29 AM

## 2021-01-29 LAB — SURGICAL PATHOLOGY

## 2021-01-29 NOTE — Discharge Summary (Signed)
Postpartum Discharge Summary  Date of Service January 29, 2021     Patient Name: Melanie Lawson DOB: 11-28-1991 MRN: [id number]  Date of admission: 01/27/2021 Delivery date:01/27/2021  Delivering provider: Everlene Farrier  Date of discharge: 01/29/2021  Admitting diagnosis: Term pregnancy [Z34.90] Intrauterine pregnancy: [redacted]w[redacted]d    Secondary diagnosis:  Active Problems:   Term pregnancy  Additional problems: none    Discharge diagnosis: Term Pregnancy Delivered                                              Post partum procedures:not applicable Augmentation: AROM, Pitocin and Cytotec Complications: None  Hospital course: Induction of Labor With Vaginal Delivery   30y.o. yo [id number] admitted to the hospital 01/27/2021 for induction of labor.  Indication for induction: Elective.  Patient had an uncomplicated labor course as follows: Membrane Rupture Time/Date: 2:33 PM ,01/27/2021   Delivery Method:Vaginal, Vacuum (Extractor)  Episiotomy: None  Lacerations:  Periurethral  Details of delivery can be found in separate delivery note.  Patient had a routine postpartum course. Patient is discharged home 01/29/21.  Newborn Data: Birth date:01/27/2021  Birth time:9:32 PM  Gender:Female  Living status:Living  Apgars:8 ,9  Weight:3850 g   Magnesium Sulfate received: No BMZ received: No Rhophylac:No MMR:No T-DaP:Given prenatally Flu: No Transfusion:No  Physical exam  Vitals:   01/28/21 0944 01/28/21 1430 01/28/21 2019 01/29/21 0500  BP: 97/68 103/63 108/76 91/63  Pulse: 64 63    Resp: 16 18 17 16   Temp: 97.9 F (36.6 C) 98.1 F (36.7 C) 97.8 F (36.6 C) 97.9 F (36.6 C)  TempSrc: Oral Oral Oral Oral  SpO2: 98% 99% 100% 99%  Weight:      Height:       General: alert, cooperative and no distress Lochia: appropriate Uterine Fundus: firm Incision: Healing well with no significant drainage DVT Evaluation: No evidence of DVT seen on physical exam. Labs: Lab Results   Component Value Date   WBC 12.3 (H) 01/28/2021   HGB 13.3 01/28/2021   HCT 37.5 01/28/2021   MCV 93.3 01/28/2021   PLT 142 (L) 01/28/2021   CMP Latest Ref Rng & Units 04/11/2013  Glucose 70 - 99 mg/dL 97  BUN 6 - 23 mg/dL 5(L)  Creatinine 0.50 - 1.10 mg/dL 0.88  Sodium 135 - 145 mEq/L 138  Potassium 3.5 - 5.1 mEq/L 3.4(L)  Chloride 96 - 112 mEq/L 104  CO2 19 - 32 mEq/L 27  Calcium 8.4 - 10.5 mg/dL 8.8  Total Protein 6.0 - 8.3 g/dL 6.0  Total Bilirubin 0.3 - 1.2 mg/dL 0.4  Alkaline Phos 39 - 117 U/L 50  AST 0 - 37 U/L 17  ALT 0 - 35 U/L 11   Edinburgh Score: Edinburgh Postnatal Depression Scale Screening Tool 01/28/2021  I have been able to laugh and see the funny side of things. 0  I have looked forward with enjoyment to things. 0  I have blamed myself unnecessarily when things went wrong. 0  I have been anxious or worried for no good reason. 0  I have felt scared or panicky for no good reason. 0  Things have been getting on top of me. 0  I have been so unhappy that I have had difficulty sleeping. 0  I have felt sad or miserable. 0  I have  been so unhappy that I have been crying. 0  The thought of harming myself has occurred to me. 0  Edinburgh Postnatal Depression Scale Total 0      After visit meds:  Allergies as of 01/29/2021      Reactions   Latex Itching, Rash      Medication List    TAKE these medications   famotidine-calcium carbonate-magnesium hydroxide [phone] MG chewable tablet Commonly known as: PEPCID COMPLETE Chew 1 tablet by mouth daily as needed (For heartburn.).   prenatal multivitamin Tabs tablet Take 1 tablet by mouth at bedtime.        Discharge home in stable condition Infant Feeding: Breast Infant Disposition:home with mother Discharge instruction: per After Visit Summary and Postpartum booklet. Activity: Advance as tolerated. Pelvic rest for 6 weeks.  Diet: routine diet Anticipated Birth Control: Unsure Postpartum Appointment:6  weeks Additional Postpartum F/U: not applicable Future Appointments:No future appointments. Follow up Visit:      01/29/2021 Cyril Mourning, MD

## 2021-03-06 DIAGNOSIS — Z1389 Encounter for screening for other disorder: Secondary | ICD-10-CM | POA: Diagnosis not present

## 2021-05-25 DIAGNOSIS — D2239 Melanocytic nevi of other parts of face: Secondary | ICD-10-CM | POA: Diagnosis not present

## 2021-05-25 DIAGNOSIS — D485 Neoplasm of uncertain behavior of skin: Secondary | ICD-10-CM | POA: Diagnosis not present

## 2023-08-24 DIAGNOSIS — Z0000 Encounter for general adult medical examination without abnormal findings: Secondary | ICD-10-CM | POA: Diagnosis not present

## 2023-08-24 DIAGNOSIS — Z23 Encounter for immunization: Secondary | ICD-10-CM | POA: Diagnosis not present

## 2023-11-02 DIAGNOSIS — J0100 Acute maxillary sinusitis, unspecified: Secondary | ICD-10-CM | POA: Diagnosis not present

## 2023-11-02 DIAGNOSIS — J029 Acute pharyngitis, unspecified: Secondary | ICD-10-CM | POA: Diagnosis not present

## 2024-03-28 DIAGNOSIS — Z01419 Encounter for gynecological examination (general) (routine) without abnormal findings: Secondary | ICD-10-CM | POA: Diagnosis not present

## 2024-03-28 DIAGNOSIS — Z6824 Body mass index (BMI) 24.0-24.9, adult: Secondary | ICD-10-CM | POA: Diagnosis not present

## 2024-03-28 DIAGNOSIS — Z124 Encounter for screening for malignant neoplasm of cervix: Secondary | ICD-10-CM | POA: Diagnosis not present

## 2024-08-19 DIAGNOSIS — S93402A Sprain of unspecified ligament of left ankle, initial encounter: Secondary | ICD-10-CM | POA: Diagnosis not present

## 2024-08-19 DIAGNOSIS — M25572 Pain in left ankle and joints of left foot: Secondary | ICD-10-CM | POA: Diagnosis not present

## 2024-08-27 DIAGNOSIS — S93409A Sprain of unspecified ligament of unspecified ankle, initial encounter: Secondary | ICD-10-CM | POA: Diagnosis not present

## 2024-08-27 DIAGNOSIS — Z0000 Encounter for general adult medical examination without abnormal findings: Secondary | ICD-10-CM | POA: Diagnosis not present

## 2024-08-27 DIAGNOSIS — Z23 Encounter for immunization: Secondary | ICD-10-CM | POA: Diagnosis not present

## 2024-08-27 DIAGNOSIS — F3281 Premenstrual dysphoric disorder: Secondary | ICD-10-CM | POA: Diagnosis not present
